# Patient Record
Sex: Female | Born: 1975
Health system: Southern US, Community
[De-identification: ages and names within clinical notes are randomized; demographics above are authoritative.]

## PROBLEM LIST (undated history)

## (undated) DIAGNOSIS — E119 Type 2 diabetes mellitus without complications: Secondary | ICD-10-CM

## (undated) DIAGNOSIS — G4730 Sleep apnea, unspecified: Secondary | ICD-10-CM

## (undated) DIAGNOSIS — I10 Essential (primary) hypertension: Secondary | ICD-10-CM

## (undated) DIAGNOSIS — L509 Urticaria, unspecified: Secondary | ICD-10-CM

## (undated) DIAGNOSIS — M51369 Other intervertebral disc degeneration, lumbar region without mention of lumbar back pain or lower extremity pain: Secondary | ICD-10-CM

## (undated) DIAGNOSIS — M5136 Other intervertebral disc degeneration, lumbar region: Secondary | ICD-10-CM

## (undated) DIAGNOSIS — M1990 Unspecified osteoarthritis, unspecified site: Secondary | ICD-10-CM

## (undated) HISTORY — DX: Urticaria, unspecified: L50.9

## (undated) HISTORY — DX: Sleep apnea, unspecified: G47.30

## (undated) HISTORY — DX: Essential (primary) hypertension: I10

## (undated) HISTORY — PX: OTHER SURGICAL HISTORY: SHX169

---

## 2015-10-21 ENCOUNTER — Encounter: Payer: Self-pay | Admitting: Osteopathic Medicine

## 2018-12-27 ENCOUNTER — Ambulatory Visit (INDEPENDENT_AMBULATORY_CARE_PROVIDER_SITE_OTHER): Payer: Managed Care, Other (non HMO) | Admitting: Osteopathic Medicine

## 2018-12-27 ENCOUNTER — Encounter: Payer: Self-pay | Admitting: Osteopathic Medicine

## 2018-12-27 ENCOUNTER — Other Ambulatory Visit: Payer: Self-pay

## 2018-12-27 VITALS — BP 164/87 | HR 86 | Temp 98.3°F | Ht 63.0 in | Wt 293.1 lb

## 2018-12-27 DIAGNOSIS — I10 Essential (primary) hypertension: Secondary | ICD-10-CM

## 2018-12-27 MED ORDER — MONTELUKAST SODIUM 10 MG PO TABS: 10.0000 mg | ORAL_TABLET | Freq: Every day | ORAL | 3 refills | Status: DC

## 2018-12-27 MED ORDER — LEVOCETIRIZINE DIHYDROCHLORIDE 5 MG PO TABS: 5.0000 mg | ORAL_TABLET | Freq: Every evening | ORAL | 3 refills | Status: DC

## 2018-12-27 MED ORDER — TIZANIDINE HCL 4 MG PO TABS: 4.0000 mg | ORAL_TABLET | Freq: Every evening | ORAL | 1 refills | Status: DC | PRN

## 2018-12-27 MED ORDER — LOSARTAN POTASSIUM 50 MG PO TABS: 50.0000 mg | ORAL_TABLET | Freq: Every day | ORAL | 1 refills | Status: DC

## 2018-12-27 NOTE — Patient Instructions (Addendum)
Goal BP 130/80 or less. I've refilled meds for now. Will watch BP and adjust meds if needed! Send me a MyChart message / phone call with BP numbers in next few weeks!   Will plan for routine annual check-up with labs and Pap sometime in the next 6 months!

## 2018-12-27 NOTE — Progress Notes (Signed)
HPI: Olivia Hodges is a 43 y.o. female who  has a past medical history of High blood pressure and Urticaria.  she presents to [facility] today, 12/27/18,  for chief complaint of: New to establish care  HTN  Pleasant new patient here to establish care. She and her wife recently moved tot he area from [place], [place] originally from [place], [place] area. Works as an Therapist, sports  HTN: not checking at home lately but they have a manual cuff and wife is also a Marine scientist.       Past medical, surgical, social and family history reviewed:  Patient Active Problem List   Diagnosis Date Noted  . Essential hypertension 12/29/2018   History reviewed. No pertinent surgical history.  Social History   Tobacco Use  . Smoking status: Never Smoker  . Smokeless tobacco: Never Used  Substance Use Topics  . Alcohol use: Never    Frequency: Never    No family history on file.   Current medication list and allergy/intolerance information reviewed:    Current Outpatient Medications  Medication Sig Dispense Refill  . levocetirizine (XYZAL) 5 MG tablet Take 1 tablet (5 mg total) by mouth every evening. 90 tablet 3  . losartan (COZAAR) 50 MG tablet Take 1 tablet (50 mg total) by mouth daily. 90 tablet 1  . montelukast (SINGULAIR) 10 MG tablet Take 1 tablet (10 mg total) by mouth at bedtime. 90 tablet 3  . tiZANidine (ZANAFLEX) 4 MG tablet Take 1 tablet (4 mg total) by mouth at bedtime as needed for muscle spasms. 90 tablet 1   No current facility-administered medications for this visit.     Allergies  Allergen Reactions  . Lisinopril Hives  . Sulfa Antibiotics Hives      Review of Systems:  Constitutional:  No  fever, no chills, No recent illness, No unintentional weight changes. No significant fatigue.   HEENT: No  headache, no vision change, no hearing change, No sore throat, No  sinus pressure  Cardiac: No  chest pain, No  pressure, No palpitations, No   Orthopnea  Respiratory:  No  shortness of breath. No  Cough  Gastrointestinal: No  abdominal pain, No  nausea, No  vomiting,  No  blood in stool, No  diarrhea, No  constipation   Musculoskeletal: No new myalgia/arthralgia  Skin: No  Rash, No other wounds/concerning lesions  Genitourinary: No  incontinence, No  abnormal genital bleeding, No abnormal genital discharge  Hem/Onc: No  easy bruising/bleeding, No  abnormal lymph node  Endocrine: No cold intolerance,  No heat intolerance. No polyuria/polydipsia/polyphagia   Neurologic: No  weakness, No  dizziness, No  slurred speech/focal weakness/facial droop  Psychiatric: No  concerns with depression, No  concerns with anxiety, No sleep problems, No mood problems  Exam:  BP (!) 164/87 (BP Location: Left Arm, Patient Position: Sitting, Cuff Size: Large)   Pulse 86   Temp 98.3 F (36.8 C) (Oral)   Ht 5' 3"  (1.6 m)   Wt 293 lb 1.3 oz (132.9 kg)   LMP 12/18/2018   BMI 51.92 kg/m   Constitutional: VS see above. General Appearance: alert, well-developed, well-nourished, NAD  Eyes: Normal lids and conjunctive, non-icteric sclera  Neck: No masses, trachea midline. No thyroid enlargement. No tenderness/mass appreciated. No lymphadenopathy  Respiratory: Normal respiratory effort. no wheeze, no rhonchi, no rales  Cardiovascular: S1/S2 normal, no murmur, no rub/gallop auscultated. RRR. No lower extremity edema.   Musculoskeletal: Gait normal.    Neurological:  Normal balance/coordination. No tremor.   Skin: warm, dry, intact. No rash/ulcer.  Psychiatric: Normal judgment/insight. Normal mood and affect. Oriented x3.    No results found for this or any previous visit (from the past 72 hour(s)).  No results found.   ASSESSMENT/PLAN: The encounter diagnosis was Essential hypertension.   To monitor B at home, may need Rx adjustment if not at goal, ?white coat component   Orders Placed This Encounter  Procedures  . CBC  .  COMPLETE METABOLIC PANEL WITH GFR  . LIPID SCREENING    Meds ordered this encounter  Medications  . levocetirizine (XYZAL) 5 MG tablet    Sig: Take 1 tablet (5 mg total) by mouth every evening.    Dispense:  90 tablet    Refill:  3  . losartan (COZAAR) 50 MG tablet    Sig: Take 1 tablet (50 mg total) by mouth daily.    Dispense:  90 tablet    Refill:  1  . montelukast (SINGULAIR) 10 MG tablet    Sig: Take 1 tablet (10 mg total) by mouth at bedtime.    Dispense:  90 tablet    Refill:  3  . tiZANidine (ZANAFLEX) 4 MG tablet    Sig: Take 1 tablet (4 mg total) by mouth at bedtime as needed for muscle spasms.    Dispense:  90 tablet    Refill:  1    Patient Instructions  Goal BP 130/80 or less. I've refilled meds for now. Will watch BP and adjust meds if needed! Send me a MyChart message / phone call with BP numbers in next few weeks!   Will plan for routine annual check-up with labs and Pap sometime in the next 6 months!         Visit summary with medication list and pertinent instructions was printed for patient to review. All questions at time of visit were answered - patient instructed to contact office with any additional concerns or updates. ER/RTC precautions were reviewed with the patient.     Please note: voice recognition software was used to produce this document, and typos may escape review. Please contact Dr. Sheppard Coil for any needed clarifications.     Follow-up plan: Return in about 6 months (around 06/27/2019) for [place] (get labs prior to visit, orders are in).

## 2018-12-29 DIAGNOSIS — I10 Essential (primary) hypertension: Secondary | ICD-10-CM | POA: Insufficient documentation

## 2019-01-02 ENCOUNTER — Encounter: Payer: Self-pay | Admitting: Osteopathic Medicine

## 2019-01-02 MED ORDER — LOSARTAN POTASSIUM 100 MG PO TABS: 100.0000 mg | ORAL_TABLET | Freq: Every day | ORAL | 1 refills | Status: DC

## 2019-06-27 ENCOUNTER — Ambulatory Visit (INDEPENDENT_AMBULATORY_CARE_PROVIDER_SITE_OTHER): Payer: No Typology Code available for payment source | Admitting: Osteopathic Medicine

## 2019-06-27 ENCOUNTER — Other Ambulatory Visit: Payer: Self-pay | Admitting: Osteopathic Medicine

## 2019-06-27 ENCOUNTER — Encounter: Payer: Self-pay | Admitting: Osteopathic Medicine

## 2019-06-27 ENCOUNTER — Ambulatory Visit (INDEPENDENT_AMBULATORY_CARE_PROVIDER_SITE_OTHER): Payer: No Typology Code available for payment source

## 2019-06-27 VITALS — BP 152/92 | HR 82 | Temp 98.0°F | Ht 63.0 in | Wt 292.0 lb

## 2019-06-27 DIAGNOSIS — M47812 Spondylosis without myelopathy or radiculopathy, cervical region: Secondary | ICD-10-CM

## 2019-06-27 DIAGNOSIS — M48061 Spinal stenosis, lumbar region without neurogenic claudication: Secondary | ICD-10-CM

## 2019-06-27 DIAGNOSIS — M4310 Spondylolisthesis, site unspecified: Secondary | ICD-10-CM

## 2019-06-27 DIAGNOSIS — M5412 Radiculopathy, cervical region: Secondary | ICD-10-CM

## 2019-06-27 DIAGNOSIS — I10 Essential (primary) hypertension: Secondary | ICD-10-CM

## 2019-06-27 DIAGNOSIS — M5136 Other intervertebral disc degeneration, lumbar region: Secondary | ICD-10-CM

## 2019-06-27 DIAGNOSIS — M4807 Spinal stenosis, lumbosacral region: Secondary | ICD-10-CM

## 2019-06-27 DIAGNOSIS — Z0000 Encounter for general adult medical examination without abnormal findings: Secondary | ICD-10-CM

## 2019-06-27 DIAGNOSIS — M5030 Other cervical disc degeneration, unspecified cervical region: Secondary | ICD-10-CM

## 2019-06-27 MED ORDER — MELOXICAM 15 MG PO TABS: 15.0000 mg | ORAL_TABLET | Freq: Every day | ORAL | 0 refills | Status: DC

## 2019-06-27 MED ORDER — LOSARTAN POTASSIUM 100 MG PO TABS: 100.0000 mg | ORAL_TABLET | Freq: Every day | ORAL | 1 refills | Status: DC

## 2019-06-27 MED ORDER — PREDNISONE 10 MG (48) PO TBPK: ORAL_TABLET | Freq: Every day | ORAL | 0 refills | Status: DC

## 2019-06-27 MED ORDER — TIZANIDINE HCL 4 MG PO TABS: 4.0000 mg | ORAL_TABLET | Freq: Every evening | ORAL | 1 refills | Status: DC | PRN

## 2019-06-27 MED FILL — LOSARTAN POTASSIUM 100 MG T: 100 | 90 days supply | Qty: 90 | Fill #0

## 2019-06-27 MED FILL — tiZANidine HCL 4 MG TABS: 4 | 90 days supply | Qty: 90 | Fill #0

## 2019-06-27 MED FILL — PREDNISONE 10 MG (48) TBPK: 10 | 12 days supply | Qty: 48 | Fill #0

## 2019-06-27 MED FILL — MELOXICAM 15 MG TABLET: 15 | 30 days supply | Qty: 30 | Fill #0

## 2019-06-27 NOTE — Patient Instructions (Signed)
General Preventive Care  Most recent routine screening labs: ordered today.   Everyone should have blood pressure checked once per year. Goal 130/80 or less.   Tobacco: don't!   Alcohol: responsible moderation is ok for most adults - if you have concerns about your alcohol intake, please talk to me!   Exercise: as tolerated to reduce risk of cardiovascular disease and diabetes. Strength training will also prevent osteoporosis.   Mental health: if need for mental health care (medicines, counseling, other), or concerns about moods, please let me know!   Sexual health: if need for STD testing, or if concerns with libido/pain problems, please let me or OBGYN know! If you need to discuss your family planning options, please let me or OBGYN know!   Advanced Directive: Living Will and/or Healthcare Power of Attorney recommended for all adults, regardless of age or health.  Vaccines  Flu vaccine: for almost everyone, every fall.   Shingles vaccine: after age 37.   Pneumonia vaccines: after age 24  Tetanus booster: every 10 years   COVID vaccine: thanks for taking the vaccine! :) Cancer screenings   Colon cancer screening: for everyone age 34-75. Colonoscopy available for all, many people also qualify for the Cologuard stool test   Breast cancer screening: mammogram at age 55 every other year at least, and annually after age 83.   Cervical cancer screening: Pap every 1 to 5 years depending on age and other risk factors. Can usually stop at age 45 or w/ hysterectomy.   Lung cancer screening: not needed for non-smokers  Infection screenings  . HIV: recommended screening at least once age 33-65, more often as needed. . Gonorrhea/Chlamydia: screening as needed. . Hepatitis C: recommended once for everyone age 36-75 . TB: certain at-risk populations, or depending on work requirements and/or travel history Other . Bone Density Test: recommended at age 19

## 2019-06-27 NOTE — Progress Notes (Signed)
Olivia Hodges is a 44 y.o. female who presents to  [place] [facility]  today, 06/27/19, seeking care for the following: . Annual physical exam  . Follow up HTN -reports BP at goal one measures at home, patient is an Therapist, sports . Longstanding neck and lower back pain, has tried/failed conservative management greater than 6 weeks including physical therapy     ASSESSMENT & PLAN with other pertinent history/findings:  The primary encounter diagnosis was Annual physical exam. Diagnoses of Essential hypertension, Cervical radiculopathy, Degenerative disc disease, cervical, Spondylosis, cervical, Degenerative disc disease, lumbar, Foraminal stenosis of lumbar region, Anterolisthesis, Other cervical disc degeneration, unspecified cervical region, and Spinal stenosis of lumbosacral region were also pertinent to this visit.  BP Readings from Last 3 Encounters:  06/27/19 (!) 152/92  12/27/18 (!) 164/87   Up-to-date preventative care as below Continue monitor BP at home We will arrange for MRI L-spine/C-spine given failure of conservative management and neurologic deficits on exam with left upper extremity and left lower extremity weakness    Patient Instructions  General Preventive Care  Most recent routine screening labs: ordered today.   Everyone should have blood pressure checked once per year. Goal 130/80 or less.   Tobacco: don't!   Alcohol: responsible moderation is ok for most adults - if you have concerns about your alcohol intake, please talk to me!   Exercise: as tolerated to reduce risk of cardiovascular disease and diabetes. Strength training will also prevent osteoporosis.   Mental health: if need for mental health care (medicines, counseling, other), or concerns about moods, please let me know!   Sexual health: if need for STD testing, or if concerns with libido/pain problems, please let me or OBGYN know! If you need to discuss  your family planning options, please let me or OBGYN know!   Advanced Directive: Living Will and/or Healthcare Power of Attorney recommended for all adults, regardless of age or health.  Vaccines  Flu vaccine: for almost everyone, every fall.   Shingles vaccine: after age 55.   Pneumonia vaccines: after age 54  Tetanus booster: every 10 years   COVID vaccine: thanks for taking the vaccine! :) Cancer screenings   Colon cancer screening: for everyone age 34-75. Colonoscopy available for all, many people also qualify for the Cologuard stool test   Breast cancer screening: mammogram at age 18 every other year at least, and annually after age 33.   Cervical cancer screening: Pap every 1 to 5 years depending on age and other risk factors. Can usually stop at age 47 or w/ hysterectomy.   Lung cancer screening: not needed for non-smokers  Infection screenings  . HIV: recommended screening at least once age 89-65, more often as needed. . Gonorrhea/Chlamydia: screening as needed. . Hepatitis C: recommended once for everyone age 31-75 . TB: certain at-risk populations, or depending on work requirements and/or travel history Other . Bone Density Test: recommended at age 63    Orders Placed This Encounter  Procedures  . DG Lumbar Spine Complete  . DG Cervical Spine 2 or 3 views  . MR Cervical Spine Wo Contrast  . MR Lumbar Spine Wo Contrast  . CBC  . Lipid panel  . COMPLETE METABOLIC PANEL WITH GFR  . Ambulatory referral to Physical Therapy    Meds ordered this encounter  Medications  . tiZANidine (ZANAFLEX) 4 MG tablet    Sig: Take 1 tablet (4 mg total) by mouth at bedtime as needed  for muscle spasms.    Dispense:  90 tablet    Refill:  1  . losartan (COZAAR) 100 MG tablet    Sig: Take 1 tablet (100 mg total) by mouth daily.    Dispense:  90 tablet    Refill:  1    Cancel 50 mg dose thanks!  . meloxicam (MOBIC) 15 MG tablet    Sig: Take 1 tablet (15 mg total) by mouth  daily.    Dispense:  30 tablet    Refill:  0  . predniSONE (STERAPRED UNI-PAK 48 TAB) 10 MG (48) TBPK tablet    Sig: Take by mouth daily. 12-Day taper, po    Dispense:  48 tablet    Refill:  0   DG Cervical Spine 2 or 3 views  Result Date: 06/28/2019 CLINICAL DATA:  Cervicalgia EXAM: CERVICAL SPINE - 2-3 VIEW COMPARISON:  None. FINDINGS: Frontal, lateral, and open-mouth odontoid images were obtained. There is no fracture or spondylolisthesis. Prevertebral soft tissues and predental space regions are normal. There is moderately severe disc space narrowing at C5-6 and C6-7 with milder disc space narrowing at C7-T1. There are prominent anterior osteophytes at C5, C6, and C7, largest at C5. No erosive change. Lung apices are clear. There is reversal of lordotic curvature. IMPRESSION: Osteoarthritic change at several levels, most severe at C5-6 and C6-7. No fracture or spondylolisthesis. Reversal of lordotic curvature is likely indicative of a degree of muscle spasm. Electronically Signed   By: Lowella Grip III M.D.   On: 06/28/2019 11:34   DG Lumbar Spine Complete  Result Date: 06/28/2019 CLINICAL DATA:  Low back pain EXAM: LUMBAR SPINE - COMPLETE 4+ VIEW COMPARISON:  None. FINDINGS: There is no evidence of lumbar spine fracture. Alignment is normal. Intervertebral disc spaces are maintained. Frontal, lateral, spot lumbosacral lateral, and bilateral oblique views were obtained. There are 5 non-rib-bearing lumbar type vertebral bodies. There is no fracture. There is 4 mm of anterolisthesis of L5 on S1. No other spondylolisthesis. There is moderately severe disc space narrowing at L4-5 and L5-S1. Other disc spaces appear unremarkable. There is facet osteoarthritic change at L4-5 and L5-S1 bilaterally. IMPRESSION: No fracture. Spondylolisthesis at L5-S1 is likely due to underlying spondylosis. There is osteoarthritic change at L4-5 and L5-S1. Electronically Signed   By: Lowella Grip III M.D.   On:  06/28/2019 11:33      Follow-up instructions: Return in 1 year (on 06/26/2020) for  , ANNUAL (call week prior to visit for lab orders).                                          BP (!) 152/92 (BP Location: Right Arm)   Pulse 82   Temp 98 F (36.7 C)   Ht 5' 3"  (1.6 m)   Wt 292 lb (132.5 kg)   LMP 06/14/2019 (Approximate)   SpO2 98%   BMI 51.73 kg/m   Current Meds  Medication Sig  . levocetirizine (XYZAL) 5 MG tablet Take 1 tablet (5 mg total) by mouth every evening.  Marland Kitchen losartan (COZAAR) 100 MG tablet Take 1 tablet (100 mg total) by mouth daily.  . montelukast (SINGULAIR) 10 MG tablet Take 1 tablet (10 mg total) by mouth at bedtime.  Marland Kitchen tiZANidine (ZANAFLEX) 4 MG tablet Take 1 tablet (4 mg total) by mouth at bedtime as needed for muscle spasms.  . [DISCONTINUED]  losartan (COZAAR) 100 MG tablet Take 1 tablet (100 mg total) by mouth daily.  . [DISCONTINUED] tiZANidine (ZANAFLEX) 4 MG tablet Take 1 tablet (4 mg total) by mouth at bedtime as needed for muscle spasms.    No results found for this or any previous visit (from the past 72 hour(s)).  DG Cervical Spine 2 or 3 views  Result Date: 06/28/2019 CLINICAL DATA:  Cervicalgia EXAM: CERVICAL SPINE - 2-3 VIEW COMPARISON:  None. FINDINGS: Frontal, lateral, and open-mouth odontoid images were obtained. There is no fracture or spondylolisthesis. Prevertebral soft tissues and predental space regions are normal. There is moderately severe disc space narrowing at C5-6 and C6-7 with milder disc space narrowing at C7-T1. There are prominent anterior osteophytes at C5, C6, and C7, largest at C5. No erosive change. Lung apices are clear. There is reversal of lordotic curvature. IMPRESSION: Osteoarthritic change at several levels, most severe at C5-6 and C6-7. No fracture or spondylolisthesis. Reversal of lordotic curvature is likely indicative of a degree of muscle spasm. Electronically Signed   By: Lowella Grip III M.D.   On: 06/28/2019 11:34   DG Lumbar Spine Complete  Result Date: 06/28/2019 CLINICAL DATA:  Low back pain EXAM: LUMBAR SPINE - COMPLETE 4+ VIEW COMPARISON:  None. FINDINGS: There is no evidence of lumbar spine fracture. Alignment is normal. Intervertebral disc spaces are maintained. Frontal, lateral, spot lumbosacral lateral, and bilateral oblique views were obtained. There are 5 non-rib-bearing lumbar type vertebral bodies. There is no fracture. There is 4 mm of anterolisthesis of L5 on S1. No other spondylolisthesis. There is moderately severe disc space narrowing at L4-5 and L5-S1. Other disc spaces appear unremarkable. There is facet osteoarthritic change at L4-5 and L5-S1 bilaterally. IMPRESSION: No fracture. Spondylolisthesis at L5-S1 is likely due to underlying spondylosis. There is osteoarthritic change at L4-5 and L5-S1. Electronically Signed   By: Lowella Grip III M.D.   On: 06/28/2019 11:33    Depression screen [facility] 2/9 06/27/2019 12/27/2018  Decreased Interest 0 0  Down, Depressed, Hopeless 0 0  PHQ - 2 Score 0 0  Altered sleeping 0 -  Tired, decreased energy 3 -  Change in appetite 0 -  Feeling bad or failure about yourself  0 -  Trouble concentrating 0 -  Moving slowly or fidgety/restless 0 -  Suicidal thoughts 0 -  PHQ-9 Score 3 -  Difficult doing work/chores Somewhat difficult -    GAD 7 : Generalized Anxiety Score 06/27/2019  Nervous, Anxious, on Edge 0  Control/stop worrying 0  Worry too much - different things 0  Trouble relaxing 0  Restless 0  Easily annoyed or irritable 3  Afraid - awful might happen 0  Total GAD 7 Score 3  Anxiety Difficulty Somewhat difficult      All questions at time of visit were answered - patient instructed to contact office with any additional concerns or updates.  ER/RTC precautions were reviewed with the patient.  Please note: voice recognition software was used to produce this document, and typos may escape review.  Please contact Dr. Sheppard Coil for any needed clarifications.

## 2019-06-28 MED FILL — MONTELUKAST SOD 10 MG TAB: 10 | 90 days supply | Qty: 90 | Fill #0

## 2019-06-29 ENCOUNTER — Encounter: Payer: Self-pay | Admitting: Osteopathic Medicine

## 2019-06-29 DIAGNOSIS — G4733 Obstructive sleep apnea (adult) (pediatric): Secondary | ICD-10-CM | POA: Insufficient documentation

## 2019-06-29 LAB — COMPLETE METABOLIC PANEL WITH GFR
AG Ratio: 1.4 (calc) (ref 1.0–2.5)
ALT: 42 U/L — ABNORMAL HIGH (ref 6–29)
AST: 34 U/L (ref 10–35)
Albumin: 4.4 g/dL (ref 3.6–5.1)
Alkaline phosphatase (APISO): 54 U/L (ref 31–125)
BUN: 14 mg/dL (ref 7–25)
CO2: 26 mmol/L (ref 20–32)
Calcium: 9.5 mg/dL (ref 8.6–10.2)
Chloride: 105 mmol/L (ref 98–110)
Creat: 0.66 mg/dL (ref 0.50–1.10)
GFR, Est African American: 123 mL/min/{1.73_m2} (ref >=60)
GFR, Est Non African American: 106 mL/min/{1.73_m2} (ref >=60)
Globulin: 3.1 g/dL (calc) (ref 1.9–3.7)
Glucose, Bld: 115 mg/dL — ABNORMAL HIGH (ref 65–99)
Potassium: 4.6 mmol/L (ref 3.5–5.3)
Sodium: 137 mmol/L (ref 135–146)
Total Bilirubin: 0.4 mg/dL (ref 0.2–1.2)
Total Protein: 7.5 g/dL (ref 6.1–8.1)

## 2019-06-29 LAB — CBC
HCT: 41.9 % (ref 35.0–45.0)
Hemoglobin: 14.1 g/dL (ref 11.7–15.5)
MCH: 30.6 pg (ref 27.0–33.0)
MCHC: 33.7 g/dL (ref 32.0–36.0)
MCV: 90.9 fL (ref 80.0–100.0)
MPV: 10.3 fL (ref 7.5–12.5)
Platelets: 315 10*3/uL (ref 140–400)
RBC: 4.61 10*6/uL (ref 3.80–5.10)
RDW: 12.7 % (ref 11.0–15.0)
WBC: 6.6 10*3/uL (ref 3.8–10.8)

## 2019-06-29 LAB — LIPID PANEL
Cholesterol: 195 mg/dL (ref <200)
HDL: 51 mg/dL (ref >=50)
LDL Cholesterol (Calc): 119 mg/dL (calc) — ABNORMAL HIGH
Non-HDL Cholesterol (Calc): 144 mg/dL (calc) — ABNORMAL HIGH (ref <130)
Total CHOL/HDL Ratio: 3.8 (calc) (ref <5.0)
Triglycerides: 132 mg/dL (ref <150)

## 2019-06-30 ENCOUNTER — Ambulatory Visit (INDEPENDENT_AMBULATORY_CARE_PROVIDER_SITE_OTHER): Payer: No Typology Code available for payment source

## 2019-06-30 ENCOUNTER — Other Ambulatory Visit: Payer: Self-pay

## 2019-06-30 DIAGNOSIS — R937 Abnormal findings on diagnostic imaging of other parts of musculoskeletal system: Secondary | ICD-10-CM

## 2019-06-30 DIAGNOSIS — M4807 Spinal stenosis, lumbosacral region: Secondary | ICD-10-CM

## 2019-06-30 DIAGNOSIS — M5030 Other cervical disc degeneration, unspecified cervical region: Secondary | ICD-10-CM

## 2019-07-02 ENCOUNTER — Encounter: Payer: Self-pay | Admitting: Osteopathic Medicine

## 2019-07-06 NOTE — Telephone Encounter (Signed)
When pulling up the referral I noticed that it had not been completed or sent anywhere due to it was ordered from an image and put in an unknown que. Every time referrals are ordered from an image they do not go to our referral que because it looks like imaging is ordering them. I have corrected this referral and sent it to [facility].

## 2019-07-09 ENCOUNTER — Ambulatory Visit (INDEPENDENT_AMBULATORY_CARE_PROVIDER_SITE_OTHER): Payer: No Typology Code available for payment source | Admitting: Rehabilitative and Restorative Service Providers"

## 2019-07-09 ENCOUNTER — Encounter: Payer: Self-pay | Admitting: Rehabilitative and Restorative Service Providers"

## 2019-07-09 ENCOUNTER — Other Ambulatory Visit: Payer: Self-pay

## 2019-07-09 DIAGNOSIS — R2689 Other abnormalities of gait and mobility: Secondary | ICD-10-CM

## 2019-07-09 DIAGNOSIS — M542 Cervicalgia: Secondary | ICD-10-CM | POA: Diagnosis not present

## 2019-07-09 DIAGNOSIS — M6281 Muscle weakness (generalized): Secondary | ICD-10-CM | POA: Diagnosis not present

## 2019-07-09 DIAGNOSIS — M5416 Radiculopathy, lumbar region: Secondary | ICD-10-CM | POA: Diagnosis not present

## 2019-07-09 NOTE — Therapy (Signed)
[place], [place], [address] Phone: [phone]   Fax:  [phone]  Physical Therapy Evaluation  Patient Details  Name: Olivia Hodges MRN: [id number] Date of Birth: 1975/11/19 Referring Provider (PT): Dr Emeterio Reeve    Encounter Date: 07/09/2019   PT End of Session - 07/09/19 1654    Visit Number 1    Number of Visits 12    Date for PT Re-Evaluation 08/20/19    PT Start Time 4492    PT Stop Time 0100    PT Time Calculation (min) 54 min    Activity Tolerance Patient tolerated treatment well           Past Medical History:  Diagnosis Date  . High blood pressure   . Urticaria    (Pressure)    History reviewed. No pertinent surgical history.  There were no vitals filed for this visit.    Subjective Assessment - 07/09/19 1609    Subjective Patient reports that she has had neck pain for the past 20 years. She had injury to LB lifting 2011. MRI showed HNP. MRI 2017 showed increased arthritis; cervical buldging disc. Currently having headaches daily from neck pain. She will have spasms in the neck which have improved some with medication. She continues to have LBP and pain into the Lt > Rt calf. Some improvement with prednisone.    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 Yes    Pain Score 3     Pain Location Back    Pain Orientation Left;Right;Lower    Pain Descriptors / Indicators Aching;Burning    Pain Type Chronic pain    Pain Radiating Towards Lt posterior/lateral hip into posterior thigh to lateral calf to plantar surface of Lt foot    Pain Onset More than a month ago    Pain Frequency Constant    Aggravating Factors  standing > 2 min;  walking; bending; reaching; stiffness with prolonged standing; standing after prolonged sitting; TENS    Pain Relieving Factors sitting; meds; lying down sometimes; heat    Multiple Pain Sites Yes    Pain Score 3    Pain Location Neck    Pain Orientation Left;Right;Mid;Lower    Pain Descriptors / Indicators Burning;Constant;Nagging    Pain Type Chronic pain    Pain Radiating Towards upper trap; shouders at times both sides today Lt    Pain Onset More than a month ago    Pain Frequency Constant    Aggravating Factors  lifting; turning head and holding for too long; arms hanging when sitting    Pain Relieving Factors heat; meds              [facility] PT Assessment - 07/09/19 0001      Assessment   Medical Diagnosis Cervicalgia; LBP; Lt LE radiculopathy     Referring Provider (PT) Dr Emeterio Reeve     Onset Date/Surgical Date 06/26/18   LBP present x 20 yrs; neck flare up ~ 1 month ago/10 yr hx    Hand Dominance Right    Next MD Visit PRN     Prior Therapy yes; 2017 and 2011       Precautions  Precautions None      Restrictions   Weight Bearing Restrictions No      Balance Screen   Has the patient fallen in the past 6 months Yes    How many times? 1    Has the patient had a decrease in activity level because of a fear of falling?  No    Is the patient reluctant to leave their home because of a fear of falling?  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     Leisure cooking; household chores; shopping; walking limited       Observation/Other  Assessments   Focus on Therapeutic Outcomes (FOTO)  47% limitation       Sensation   Additional Comments at times into Lt little and ring fingers; standing Lt > Rt posterior lateral LE       Posture/Postural Control   Posture Comments rounded forward head and shoulder; flexed forward at hips; Rt LE straight w/ knee hyperextended Lt LE flexed at hip and knee       AROM   Overall AROM Comments stiffness and tightness with cervical ROM    Right/Left Shoulder --   WFL's bilat UE's    Right/Left Hip --   tight end ranges due to obesity    Cervical Flexion 62    Cervical Extension 50    Cervical - Right Side Bend 47    Cervical - Left Side Bend 52    Cervical - Right Rotation 82    Cervical - Left Rotation 75    Lumbar Flexion 80%    Lumbar Extension 10%    Lumbar - Right Side Bend 65%    Lumbar - Left Side Bend 70% discomfort LB    Lumbar - Right Rotation 50%    Lumbar - Left Rotation 50%       Strength   Right/Left Shoulder --   4+/5 to 5-/5 throughout bilat UE's    Right Hip Flexion 4+/5    Right Hip Extension 4/5    Right Hip ABduction 4+/5    Left Hip Flexion 4-/5    Left Hip Extension 4-/5    Left Hip ABduction 4-/5      Flexibility   Hamstrings WNL's bilat     Quadriceps WFL's bilat     ITB WNL's bilat     Piriformis tight bilat Lt > Rt       Palpation   Spinal mobility hypomobility lumbar spine; upper thoracic and cervical spine with PA mobs    Palpation comment muscular tightness Lt hip flexor > Rt; Lt posterior hip through gluts and abductors; bilat ant/lat/post cervical musculature; upper traps; leveator; pecs; teres       Special Tests   Other special tests (-) SLR; Fabers       Ambulation/Gait   Gait Comments ambulates with limp Lt LE with wt bearing Lt  Objective measurements completed on examination: See above findings.       [facility] Adult PT Treatment/Exercise - 07/09/19 0001      Self-Care   Self-Care Other  Self-Care Comments    Other Self-Care Comments  pt educated on self massage with ball to upper back musculature; pt returned demo with cues.       Neck Exercises: Seated   Neck Retraction 5 reps;3 secs    Other Seated Exercise scap retraction x 5 sec x 5 reps      Lumbar Exercises: Stretches   Hip Flexor Stretch Right;Left;2 reps;20 seconds   seated with leg back     Neck Exercises: Stretches   Other Neck Stretches low doorway stretch x 30 sec 2 reps                  PT Education - 07/09/19 1659    Education Details POC, HEP, posture and body mechanics.    Person(s) Educated Patient    Methods Explanation;Demonstration;Handout    Comprehension Verbalized understanding;Returned demonstration               PT Long Term Goals - 07/09/19 1706      PT LONG TERM GOAL #1   Title Initiate exercise and back care education with patient to verbalize understanding of improtance of exercise on a consistent basis and basic back care principles    Time 6    Period Weeks    Status New    Target Date 08/20/19      PT LONG TERM GOAL #2   Title Increase strength Lt LE to 4+/5 to 5/5    Time 6    Period Weeks    Status New    Target Date 08/20/19      PT LONG TERM GOAL #3   Title Patient to report 25% decrease in frequency, intensity or duration of pain    Time 6    Period Weeks    Status New    Target Date 08/20/19      PT LONG TERM GOAL #4   Title Independent in HEP    Time 6    Period Weeks    Status New    Target Date 08/20/19      PT LONG TERM GOAL #5   Title Establish aquatic exercise program    Time 6    Period Weeks    Status New      Additional Long Term Goals   Additional Long Term Goals Yes      PT LONG TERM GOAL #6   Title Improve FOTO to </= 40% limitation    Time 6    Period Weeks    Status New    Target Date 08/20/19                  Plan - 07/09/19 1654    Clinical Impression Statement Patient presents with chronic LBP and  cervical pain and dysfunction. She has poor posture and alignment; limited trunk and LE mobility; weakness; core instability; pain; limited functional and work abilities; obesity; sedentary lifestyle    Personal Factors and Comorbidities Comorbidity 1;Comorbidity 2;Comorbidity 3+    Comorbidities obesity; HTN; chronic pain    Examination-Activity Limitations Locomotion Level;Transfers;Bend;Lift;Squat;Stand;Stairs    Examination-Participation Restrictions Cleaning;Laundry;Shop    Stability/Clinical Decision Making Evolving/Moderate complexity    Clinical Decision Making Moderate    Rehab Potential Good    PT Frequency 2x / week    PT Duration 6  weeks    PT Treatment/Interventions Patient/family education;ADLs/Self Care Home Management;Cryotherapy;Moist Heat;Ultrasound;Functional mobility training;Gait training;Stair training;Therapeutic activities;Therapeutic exercise;Balance training;Neuromuscular re-education;Manual techniques;Dry needling;Taping    PT Next Visit Plan review HEP; progress with core stabilization and strengthening; stretching; further assess SI and leg length difference as indicated; myofacial release work/manual work; education in back care and Economist; aquatic therapy   patient reports good decrease in pain with prednisone - may be masking true pain levels and functional abilities - reflected in FOTO score and physical findings   PT Home Exercise Plan [email or web address]    Recommended Other Services nutritionist    Consulted and Agree with Plan of Care Patient           Patient will benefit from skilled therapeutic intervention in order to improve the following deficits and impairments:  Pain, Difficulty walking, Decreased range of motion, Increased fascial restricitons  Visit Diagnosis: Cervicalgia - Plan: PT plan of care cert/re-cert  Radiculopathy, lumbar region - Plan: PT plan of care cert/re-cert  Muscle weakness (generalized) - Plan: PT plan of care  cert/re-cert  Other abnormalities of gait and mobility - Plan: PT plan of care cert/re-cert     Problem List Patient Active Problem List   Diagnosis Date Noted  . OSA on CPAP 06/29/2019  . Essential hypertension 12/29/2018    Tamecia Mcdougald Nilda Simmer PT, MPH  07/09/2019, 5:15 PM  [facility] [place], [place], [address] Phone: [phone]   Fax:  [phone]  Name: Olivia Hodges MRN: [id number] Date of Birth: [birth year]

## 2019-07-09 NOTE — Patient Instructions (Signed)
Access Code: [email or web address] URL: [email or web address] Date: 06/14/2021Prepared by: Crafton with Neck Elongation - 3 x daily - 7 x weekly - 1 sets - 5-10 reps - 5 hold  Seated Scapular Retraction - 3 x daily - 7 x weekly - 1 sets - 5-10 reps - 5 hold  Seated Hip Flexor Stretch - 2 x daily - 7 x weekly - 1 sets - 3 reps - 30 hold  Doorway Pec Stretch at 60 Elevation - 2 x daily - 7 x weekly - 1 sets - 3 reps - 30 hold Patient Education  Programme researcher, broadcasting/film/video

## 2019-07-12 ENCOUNTER — Other Ambulatory Visit: Payer: Self-pay

## 2019-07-12 ENCOUNTER — Ambulatory Visit (INDEPENDENT_AMBULATORY_CARE_PROVIDER_SITE_OTHER): Payer: No Typology Code available for payment source | Admitting: Physical Therapy

## 2019-07-12 DIAGNOSIS — M542 Cervicalgia: Secondary | ICD-10-CM | POA: Diagnosis not present

## 2019-07-12 DIAGNOSIS — M5416 Radiculopathy, lumbar region: Secondary | ICD-10-CM | POA: Diagnosis not present

## 2019-07-12 DIAGNOSIS — R2689 Other abnormalities of gait and mobility: Secondary | ICD-10-CM

## 2019-07-12 DIAGNOSIS — M6281 Muscle weakness (generalized): Secondary | ICD-10-CM | POA: Diagnosis not present

## 2019-07-12 NOTE — Therapy (Signed)
[place], [place], [address] Phone: [phone]   Fax:  [phone]  Physical Therapy Treatment  Patient Details  Name: Olivia Hodges MRN: [id number] Date of Birth: 06-16-1975 Referring Provider (PT): Dr Emeterio Reeve    Encounter Date: 07/12/2019   PT End of Session - 07/12/19 1448    Visit Number 2    Number of Visits 12    Date for PT Re-Evaluation 08/20/19    PT Start Time 1448    PT Stop Time 1534    PT Time Calculation (min) 46 min    Activity Tolerance Patient tolerated treatment well           Past Medical History:  Diagnosis Date  . High blood pressure   . Urticaria    (Pressure)    No past surgical history on file.  There were no vitals filed for this visit.   Subjective Assessment - 07/12/19 1452    Subjective Pt reports her Lt leg is beginning to feel heavier and more symptoms.  She is finishing Prednisone taper today.    Patient Stated Goals less pain and a little bit more mobility to avoid surgery    Currently in Pain? Yes    Pain Score 5     Pain Location Back    Pain Orientation Left;Lower;Right    Pain Radiating Towards Lt hip to lateral calf and lateral foot    Aggravating Factors  standing greater than 2 min    Pain Relieving Factors sitting, medication              [facility] PT Assessment - 07/12/19 0001      Assessment   Medical Diagnosis Cervicalgia; LBP; Lt LE radiculopathy     Referring Provider (PT) Dr Emeterio Reeve     Onset Date/Surgical Date 06/26/18   LBP present x 20 yrs; neck flare up ~ 1 month ago/10 yr hx    Hand Dominance Right    Next MD Visit PRN     Prior Therapy yes; 2017 and 2011                          [facility] Adult PT Treatment/Exercise - 07/12/19 0001      Neck Exercises: Machines for Strengthening   Nustep L5: arms/legs x 5 min       Neck Exercises: Seated   W Back 10 reps    Other Seated Exercise rows  with red band x 10 reps (some spasming of low back)      Neck Exercises: Prone   Axial Exension 10 reps   5 sec hold    Other Prone Exercise cervical flex to neutral x 3, then diagonals x 2 reps.       Lumbar Exercises: Stretches   Lower Trunk Rotation 4 reps;20 seconds    Hip Flexor Stretch Right;Left;2 reps;20 seconds   seated with leg back   Prone on Elbows Stretch 2 reps;60 seconds    Press Ups 3 reps;5 seconds      Lumbar Exercises: Seated   Sit to Stand 5 reps   core engaged.      Lumbar Exercises: Supine   Ab Set 10 reps;5 seconds    AB Set Limitations pt instructed in 4 part core    Clam 10 reps   with ab set   Bent Knee Raise 10 reps   with ab set     Neck  Exercises: Stretches   Other Neck Stretches mid and high position doorway stretch x 15 sec x 2 reps                   PT Education - 07/12/19 1818    Education Details HEP    Person(s) Educated Patient    Methods Explanation;Handout    Comprehension Verbalized understanding;Returned demonstration               PT Long Term Goals - 07/09/19 1706      PT LONG TERM GOAL #1   Title Initiate exercise and back care education with patient to verbalize understanding of improtance of exercise on a consistent basis and basic back care principles    Time 6    Period Weeks    Status New    Target Date 08/20/19      PT LONG TERM GOAL #2   Title Increase strength Lt LE to 4+/5 to 5/5    Time 6    Period Weeks    Status New    Target Date 08/20/19      PT LONG TERM GOAL #3   Title Patient to report 25% decrease in frequency, intensity or duration of pain    Time 6    Period Weeks    Status New    Target Date 08/20/19      PT LONG TERM GOAL #4   Title Independent in HEP    Time 6    Period Weeks    Status New    Target Date 08/20/19      PT LONG TERM GOAL #5   Title Establish aquatic exercise program    Time 6    Period Weeks    Status New      Additional Long Term Goals   Additional Long  Term Goals Yes      PT LONG TERM GOAL #6   Title Improve FOTO to </= 40% limitation    Time 6    Period Weeks    Status New    Target Date 08/20/19                 Plan - 07/12/19 1522    Clinical Impression Statement Pt reported slight reduction in pain in LLE with prone on elbows stretch.  All other exercises went well, without increasing pain in LLE, LB or neck.  No goals met yet; only 2nd visit.    Personal Factors and Comorbidities Comorbidity 1;Comorbidity 2;Comorbidity 3+    Comorbidities obesity; HTN; chronic pain    Examination-Activity Limitations Locomotion Level;Transfers;Bend;Lift;Squat;Stand;Stairs    Examination-Participation Restrictions Cleaning;Laundry;Shop    Stability/Clinical Decision Making Evolving/Moderate complexity    Rehab Potential Good    PT Frequency 2x / week    PT Duration 6 weeks    PT Treatment/Interventions Patient/family education;ADLs/Self Care Home Management;Cryotherapy;Moist Heat;Ultrasound;Functional mobility training;Gait training;Stair training;Therapeutic activities;Therapeutic exercise;Balance training;Neuromuscular re-education;Manual techniques;Dry needling;Taping    PT Next Visit Plan send recert to add aquatics.  cont back care education and postural strengthening.   patient reports good decrease in pain with prednisone - may be masking true pain levels and functional abilities - reflected in FOTO score and physical findings   PT Home Exercise Plan [email or web address]    Consulted and Agree with Plan of Care Patient           Patient will benefit from skilled therapeutic intervention in order to improve the following deficits and impairments:  Pain, Difficulty walking, Decreased range  of motion, Increased fascial restricitons  Visit Diagnosis: Cervicalgia  Radiculopathy, lumbar region  Muscle weakness (generalized)  Other abnormalities of gait and mobility     Problem List Patient Active Problem List   Diagnosis Date Noted  .  OSA on CPAP 06/29/2019  . Essential hypertension 12/29/2018   [name], PTA 07/12/19 6:20 PM  [facility] [place], [place], [address] Phone: [phone]   Fax:  [phone]  Name: Olivia Hodges MRN: [id number] Date of Birth: May 05, 1975

## 2019-07-12 NOTE — Patient Instructions (Signed)
Access Code: [email or web address]: [email or web address]: 06/17/2021Prepared by: Imogene with Neck Elongation - 3 x daily - 7 x weekly - 1 sets - 5-10 reps - 5 hold  Seated Hip Flexor Stretch - 2 x daily - 7 x weekly - 1 sets - 3 reps - 30 hold  Seated Shoulder W - 1 x daily - 7 x weekly - 3 sets - 10 reps  Doorway Pec Stretch at 90 Degrees Abduction - 2 x daily - 7 x weekly - 1 sets - 2-3 reps - 15-20 hold  Prone on Elbows Stretch - 2 x daily - 7 x weekly - 1 sets - 2 reps - 30-60 hold  Supine Transversus Abdominis Bracing - Hands on Stomach - 1 x daily - 7 x weekly - 1 sets - 10 reps - 5 hold

## 2019-07-16 ENCOUNTER — Other Ambulatory Visit: Payer: Self-pay

## 2019-07-16 ENCOUNTER — Encounter: Payer: Self-pay | Admitting: Physical Therapy

## 2019-07-16 ENCOUNTER — Ambulatory Visit (INDEPENDENT_AMBULATORY_CARE_PROVIDER_SITE_OTHER): Payer: No Typology Code available for payment source | Admitting: Physical Therapy

## 2019-07-16 DIAGNOSIS — M542 Cervicalgia: Secondary | ICD-10-CM | POA: Diagnosis not present

## 2019-07-16 DIAGNOSIS — R2689 Other abnormalities of gait and mobility: Secondary | ICD-10-CM | POA: Diagnosis not present

## 2019-07-16 DIAGNOSIS — M5416 Radiculopathy, lumbar region: Secondary | ICD-10-CM | POA: Diagnosis not present

## 2019-07-16 DIAGNOSIS — M6281 Muscle weakness (generalized): Secondary | ICD-10-CM

## 2019-07-16 NOTE — Therapy (Signed)
[place], [place], [address] Phone: [phone]   Fax:  [phone]  Physical Therapy Treatment  Patient Details  Name: Olivia Hodges MRN: [id number] Date of Birth: 10/21/1975 Referring Provider (PT): Dr Emeterio Reeve    Encounter Date: 07/16/2019   PT End of Session - 07/16/19 1414    Visit Number 3    Number of Visits 12    Date for PT Re-Evaluation 08/20/19    PT Start Time 1351    PT Stop Time 1429    PT Time Calculation (min) 38 min    Activity Tolerance Patient tolerated treatment well           Past Medical History:  Diagnosis Date  . High blood pressure   . Urticaria    (Pressure)    History reviewed. No pertinent surgical history.  There were no vitals filed for this visit.   Subjective Assessment - 07/16/19 1355    Subjective Pt reports her pain is a little worse. She attributes that to finishing her round of Prednisone.    Currently in Pain? Yes    Pain Score 5     Pain Location Hip    Pain Orientation Left    Pain Descriptors / Indicators Aching    Aggravating Factors  standing greater than 2 min    Pain Relieving Factors sitting, medication    Pain Score 3    Pain Location Neck    Pain Orientation Left              [facility] PT Assessment - 07/16/19 0001      Assessment   Medical Diagnosis Cervicalgia; LBP; Lt LE radiculopathy     Referring Provider (PT) Dr Emeterio Reeve     Onset Date/Surgical Date 06/26/18   LBP present x 20 yrs; neck flare up ~ 1 month ago/10 yr hx    Hand Dominance Right    Next MD Visit PRN     Prior Therapy yes; 2017 and 2011              [facility] Adult PT Treatment/Exercise - 07/16/19 0001      Neck Exercises: Supine   Other Supine Exercise scap squeeze x 5 sec x 5 reps       Lumbar Exercises: Stretches   Lower Trunk Rotation 4 reps;20 seconds   limited Rt trunk rotation   Hip Flexor Stretch Right;Left;2 reps;20 seconds    seated with leg back   Hip Flexor Stretch Limitations seated 1st, RLE thomas position x 2 reps    Prone on Elbows Stretch 1 rep;60 seconds    Quad Stretch Left;1 rep;20 seconds   PTA assist in prone   ITB Stretch Left;1 rep;20 seconds   trial in supine; no stretch only pain in Lt groin   Piriformis Stretch Left;2 reps;30 seconds;Right;1 rep;20 seconds   modified pigeon pose on low mat table.      Lumbar Exercises: Aerobic   Nustep L5: 6 min (arms/legs)      Lumbar Exercises: Supine   Bridge 5 reps      Manual Therapy   Manual Therapy Soft tissue mobilization    Soft tissue mobilization STM including TPR to Lt prox quad, Lt glute med and piriformis with contract relax;  STM to Lt hip flexor.           verbally reviewed doorway stretch and W's from HEP.  PT Long Term Goals - 07/09/19 1706      PT LONG TERM GOAL #1   Title Initiate exercise and back care education with patient to verbalize understanding of improtance of exercise on a consistent basis and basic back care principles    Time 6    Period Weeks    Status New    Target Date 08/20/19      PT LONG TERM GOAL #2   Title Increase strength Lt LE to 4+/5 to 5/5    Time 6    Period Weeks    Status New    Target Date 08/20/19      PT LONG TERM GOAL #3   Title Patient to report 25% decrease in frequency, intensity or duration of pain    Time 6    Period Weeks    Status New    Target Date 08/20/19      PT LONG TERM GOAL #4   Title Independent in HEP    Time 6    Period Weeks    Status New    Target Date 08/20/19      PT LONG TERM GOAL #5   Title Establish aquatic exercise program    Time 6    Period Weeks    Status New      Additional Long Term Goals   Additional Long Term Goals Yes      PT LONG TERM GOAL #6   Title Improve FOTO to </= 40% limitation    Time 6    Period Weeks    Status New    Target Date 08/20/19                 Plan - 07/16/19 1401    Clinical  Impression Statement Pt tolerated Thomas and seated Lt hip flexor stretch well.  Point tender in prox ant Lt thigh/groin, glute med and piriformis; improved with TPR to areas.  Pt reported improved pain in Lt hip to 2/10 by end of session. Progressing towards goals.    Personal Factors and Comorbidities Comorbidity 1;Comorbidity 2;Comorbidity 3+    Comorbidities obesity; HTN; chronic pain    Examination-Activity Limitations Locomotion Level;Transfers;Bend;Lift;Squat;Stand;Stairs    Examination-Participation Restrictions Cleaning;Laundry;Shop    Stability/Clinical Decision Making Evolving/Moderate complexity    Rehab Potential Good    PT Frequency 2x / week    PT Duration 6 weeks    PT Treatment/Interventions Patient/family education;ADLs/Self Care Home Management;Cryotherapy;Moist Heat;Ultrasound;Functional mobility training;Gait training;Stair training;Therapeutic activities;Therapeutic exercise;Balance training;Neuromuscular re-education;Manual techniques;Dry needling;Taping;Aquatic Therapy    PT Next Visit Plan --   patient reports good decrease in pain with prednisone - may be masking true pain levels and functional abilities - reflected in FOTO score and physical findings   PT Home Exercise Plan [email or web address]    Consulted and Agree with Plan of Care Patient           Patient will benefit from skilled therapeutic intervention in order to improve the following deficits and impairments:  Pain, Difficulty walking, Decreased range of motion, Increased fascial restricitons  Visit Diagnosis: Radiculopathy, lumbar region  Muscle weakness (generalized)  Other abnormalities of gait and mobility  Cervicalgia     Problem List Patient Active Problem List   Diagnosis Date Noted  . OSA on CPAP 06/29/2019  . Essential hypertension 12/29/2018   [name], PTA 07/16/19 9:41 PM  [place], [place],  [address] Phone: [phone]   Fax:  [phone]  Name: Olivia Hodges MRN: [id number] Date of Birth: 21-Feb-1975

## 2019-07-16 NOTE — Patient Instructions (Signed)
Aquatic Therapy: What to Expect!  Where:  [address], [place]  [address]      [phone]        How to Prepare: . Please make sure you drink 8 ounces of water about one hour prior to your pool session . If you need any assistance with dressing, please have a caregiver with you for the entire appointment. . Please arrive IN YOUR SUIT and 10 minutes prior to your appointment - a health screen will be completed as you enter the [facility].  . Please make sure to attend to any toileting needs prior to entering the pool. . Once on the pool deck your therapist will ask you to sign the Patient  Consent and Assignment of Benefits form. . Your therapist may take your blood pressure prior to, during and after your session if indicated.  About the pool: 1. Entering the pool your therapist will assist you; there are multiple ways to enter including stairs with railings, a walk in ramp, a roll in chair and a mechanical lift. Your therapist will determine the most appropriate way for you. 2. Water temperature is usually between 86-87 degrees. 3. There may be other swimmers in the pool at the same time.   Contact information:      Appointments:  45 minutes [place] Phone [phone] *Please call [facility] if you need to cancel or reschedule an appointment.     Aquatic Therapy:  Keeping Everyone Safe!!!  We are so excited to be back in the pool for therapy and can't wait to see you in the water!! Having said that, we also want to make sure that we keep you, your family member, and everyone one else safe.  We have been in touch with our national aquatic association as well as the CDC to develop safe guidelines for aquatic therapy. First, we want to assure you that the water is one of the safest places to be right now - chlorine and bromine kill the virus and the CDC states the virus  cannot be transmitted in a pool; that's great news for [place]! Below are specific guidelines from the CDC that we will ask you and your caregiver to follow when coming to the [facility] for therapy. 1. Please shower AT HOME and COME IN YOUR SUIT and cover up to the pool. 2. Please ensure that you are ON TIME and ready (meaning that you are in your suit and ready to enter the pool) for your appointment - all of our pool appointments are full and there is a waiting list. If you are late, we cannot extend your time in the pool.   3. Locker rooms are open but limited to 4 people at a time. At the end of your session, you can either change into dry clothes in the locker room or plan to leave in your suit /cover up and change at home. If you require assistance with this, your caregiver will need to provide that assistance.  4. Follow the Aquatic Center's guidelines to use bathroom/locker room facilities. Signs are posted to provide guidelines for you.  5. The [facility] staff will complete a health screen for you and caregiver as you enter the building.  Once this is completed, PLEASE PROCEED DIRECTLY TO THE POOL DECK. We will be waiting for you there! 6. Masks:  your caregiver  must wear a mask at all times. The CDC recommends that patients and therapists wear their masks until we enter the water and then put them back on as we leave the water. PLEASE BRING A PLASTIC BAG TO STORE YOUR MASK IN WHILE WE ARE IN THE POOL.   Additional safety measures: 1. The [employer] will be practicing social distancing, wearing masks and implementing a stringent cleaning program.  2. We will only be using hard surface equipment and we will be cleaning it between all patients. 3. We will be cleaning hand rails used to enter the pool and chairs that your caregiver might use. 4. We as employees of [employer] must complete a screening every day we work prior to our shift. 5. We are only offering aquatic therapy  to patients who have been determined to be at low risk for the virus - we are happy to share that screen with you if you are interested.  We are excited to be able to offer aquatic therapy again in addition to services at our outpatient clinic - thank you for the opportunity to serve you!

## 2019-07-16 NOTE — Addendum Note (Signed)
Addended by: Everardo All on: 07/16/2019 09:20 AM   Modules accepted: Orders

## 2019-07-20 ENCOUNTER — Ambulatory Visit (INDEPENDENT_AMBULATORY_CARE_PROVIDER_SITE_OTHER): Payer: No Typology Code available for payment source | Admitting: Physical Therapy

## 2019-07-20 ENCOUNTER — Encounter: Payer: Self-pay | Admitting: Physical Therapy

## 2019-07-20 DIAGNOSIS — M5416 Radiculopathy, lumbar region: Secondary | ICD-10-CM | POA: Diagnosis not present

## 2019-07-20 DIAGNOSIS — M6281 Muscle weakness (generalized): Secondary | ICD-10-CM | POA: Diagnosis not present

## 2019-07-20 DIAGNOSIS — R2689 Other abnormalities of gait and mobility: Secondary | ICD-10-CM | POA: Diagnosis not present

## 2019-07-20 DIAGNOSIS — M542 Cervicalgia: Secondary | ICD-10-CM | POA: Diagnosis not present

## 2019-07-20 NOTE — Therapy (Signed)
[place], [place], [address] Phone: [phone]   Fax:  [phone]  Physical Therapy Treatment  Patient Details  Name: Olivia Hodges MRN: [id number] Date of Birth: September 30, 1975 Referring Provider (PT): Dr Emeterio Reeve    Encounter Date: 07/20/2019   PT End of Session - 07/20/19 1919    Visit Number 4    Number of Visits 12    Date for PT Re-Evaluation 08/20/19    PT Start Time 1509    PT Stop Time 1558    PT Time Calculation (min) 49 min    Activity Tolerance Patient tolerated treatment well;No increased pain    Behavior During Therapy [facility] for tasks assessed/performed          Subjective:  Pt reports 4/10 pain in bilat SI joints.  Standing makes it worse, sitting makes it better.  She bough a massage gun to work on hip; thinks it is helping.   Stretches have been helping.  She reports 25% improvement since starting therapy.    Past Medical History:  Diagnosis Date  . High blood pressure   . Urticaria    (Pressure)    History reviewed. No pertinent surgical history.  There were no vitals filed for this visit.    Aquatic Therapy at [facility] - pool temp 87 degrees Farenheit.  Pt seen for aquatic therapy today.  Treatment took place in water 3-3.75 ft in depth.  Pt entered/exited the pool via the ramp with single rail and supervision.   Exercises:  * Runner's stretch R/L 30 sec x 2 reps each. Hip flexor stretch with hands on pool rail, legs in lunge position with post pelvic tilt.  Hamstring stretch with foot on bench (in water) with single UE support x 20 sec x 2 reps each leg; cues for form.  *Pt performed gait training in pool forwards 25 m x2. Side stepping with buoyancy cuffs on ankles x 20 meters Rt/ 20 meters Lt - 2 sets, 1 with straight knees 1 in semi-squat position, with arms Abdct/add.  * Ai Chi postures (in semi squat position- water to clavicles): Floating, Uplifting, Enclosing x  10 each for core stabilization and balance. (In single high kneel position): Soothing x 10 reps each side.  * Hip ext x 10 with UE on pool edge; cues for form and core engagement.   Ai Chi: Balancing posture each leg x 10 * Hip flex/ext with buoyancy cuffs on ankles, core engaged x 10 * High knee marching (dynamic) with buoyancy cuffs on ankles 25 m, (with/without reciprocal arm swing under water) * Leg hip circumduction with knee flexed to increase motion x 10 (tactile cues required) * Heel raises x 10 without UE support * Lt SLS x 15 sec x 2, Rt SLS x 15 sec - perturbations provided by water currents      PT Long Term Goals - 07/09/19 1706      PT LONG TERM GOAL #1   Title Initiate exercise and back care education with patient to verbalize understanding of improtance of exercise on a consistent basis and basic back care principles    Time 6    Period Weeks    Status New    Target Date 08/20/19      PT LONG TERM GOAL #2   Title Increase strength Lt LE to 4+/5 to 5/5    Time 6    Period Weeks    Status New    Target  Date 08/20/19      PT LONG TERM GOAL #3   Title Patient to report 25% decrease in frequency, intensity or duration of pain    Time 6    Period Weeks    Status New    Target Date 08/20/19      PT LONG TERM GOAL #4   Title Independent in HEP    Time 6    Period Weeks    Status New    Target Date 08/20/19      PT LONG TERM GOAL #5   Title Establish aquatic exercise program    Time 6    Period Weeks    Status New      Additional Long Term Goals   Additional Long Term Goals Yes      PT LONG TERM GOAL #6   Title Improve FOTO to </= 40% limitation    Time 6    Period Weeks    Status New    Target Date 08/20/19                 Plan - 07/20/19 1923    Clinical Impression Statement Pt reported reduction of pain in low back and sacrum when in pool; intermittent pain in Lt groin dependent on exercise. Decreased SLS on LLE.  Pt requires buoyancy for  support and to offload joints with strengthening exercises.  Reduced gait deviations observed due to reduced joint loading through buoyancy, allowing pt to walk with improved posture, with less pain.  Pt progressing towards goals.    Personal Factors and Comorbidities Comorbidity 1;Comorbidity 2;Comorbidity 3+    Comorbidities obesity; HTN; chronic pain    Examination-Activity Limitations Locomotion Level;Transfers;Bend;Lift;Squat;Stand;Stairs    Examination-Participation Restrictions Cleaning;Laundry;Shop    Stability/Clinical Decision Making Evolving/Moderate complexity    Rehab Potential Good    PT Frequency 2x / week    PT Duration 6 weeks    PT Treatment/Interventions Patient/family education;ADLs/Self Care Home Management;Cryotherapy;Moist Heat;Ultrasound;Functional mobility training;Gait training;Stair training;Therapeutic activities;Therapeutic exercise;Balance training;Neuromuscular re-education;Manual techniques;Dry needling;Taping;Aquatic Therapy    PT Next Visit Plan --   patient reports good decrease in pain with prednisone - may be masking true pain levels and functional abilities - reflected in FOTO score and physical findings   PT Home Exercise Plan [id number]    Consulted and Agree with Plan of Care Patient           Patient will benefit from skilled therapeutic intervention in order to improve the following deficits and impairments:  Pain, Difficulty walking, Decreased range of motion, Increased fascial restricitons  Visit Diagnosis: Radiculopathy, lumbar region  Muscle weakness (generalized)  Other abnormalities of gait and mobility  Cervicalgia     Problem List Patient Active Problem List   Diagnosis Date Noted  . OSA on CPAP 06/29/2019  . Essential hypertension 12/29/2018   [name], PTA 07/20/19 4:28 PM  [place], [place], [address] Phone: [phone]   Fax:   [phone]  Name: Olivia Hodges MRN: [id number] Date of Birth: Nov 26, 1975

## 2019-07-25 ENCOUNTER — Ambulatory Visit (INDEPENDENT_AMBULATORY_CARE_PROVIDER_SITE_OTHER): Payer: No Typology Code available for payment source | Admitting: Physical Therapy

## 2019-07-25 ENCOUNTER — Other Ambulatory Visit: Payer: Self-pay

## 2019-07-25 DIAGNOSIS — M6281 Muscle weakness (generalized): Secondary | ICD-10-CM | POA: Diagnosis not present

## 2019-07-25 DIAGNOSIS — R2689 Other abnormalities of gait and mobility: Secondary | ICD-10-CM | POA: Diagnosis not present

## 2019-07-25 DIAGNOSIS — M5416 Radiculopathy, lumbar region: Secondary | ICD-10-CM

## 2019-07-25 NOTE — Therapy (Signed)
[place], [place], [address] Phone: [phone]   Fax:  [phone]  Physical Therapy Treatment  Patient Details  Name: Olivia Hodges MRN: [id number] Date of Birth: 02-09-1975 Referring Provider (PT): Dr Emeterio Reeve    Encounter Date: 07/25/2019   PT End of Session - 07/25/19 1608    Visit Number 5    Number of Visits 12    Date for PT Re-Evaluation 08/20/19    PT Start Time 1603    PT Stop Time 1645    PT Time Calculation (min) 42 min    Activity Tolerance Patient tolerated treatment well    Behavior During Therapy [facility] for tasks assessed/performed           Past Medical History:  Diagnosis Date  . High blood pressure   . Urticaria    (Pressure)    No past surgical history on file.  There were no vitals filed for this visit.   Subjective Assessment - 07/25/19 1608    Subjective Pt reports she was sore the night of last (aquatic) session (muscle soreness).  She has been sitting in a folding chair much of last week due to company at house.  She has done the stretches but they have only given minimal relief.  She visits surgeon next week (08/01/19). "Today is the worst day I've had in a while".    Patient Stated Goals less pain and a little bit more mobility to avoid surgery    Currently in Pain? Yes    Pain Score 7     Pain Location Back    Pain Orientation Left    Pain Radiating Towards wraps Lt lateral hip to medial Lt knee    Aggravating Factors  standing greater than 2 min    Pain Relieving Factors sitting, medication              [facility] PT Assessment - 07/25/19 0001      Assessment   Medical Diagnosis Cervicalgia; LBP; Lt LE radiculopathy     Referring Provider (PT) Dr Emeterio Reeve     Onset Date/Surgical Date 06/26/18   LBP present x 20 yrs; neck flare up ~ 1 month ago/10 yr hx    Hand Dominance Right    Next MD Visit PRN     Prior Therapy yes; 2017 and 2011        Palpation   SI assessment  Rt PSIS higher than, Lt PSIS lower.   Lt ASIS higher than Rt.  Lt leg appears longer.     Palpation comment point tender along Lt sacral border.              [facility] Adult PT Treatment/Exercise - 07/25/19 0001      Lumbar Exercises: Stretches   Lower Trunk Rotation 4 reps;10 seconds   feet wide, more for hip rotation stretch   Hip Flexor Stretch Left;2 reps;Right;1 rep;30 seconds    Prone on Elbows Stretch 3 reps;20 seconds    Quad Stretch Left;20 seconds;2 reps   PTA assist in prone   Piriformis Stretch Left;2 reps;Right;1 rep;20 seconds   modified table pigeon pose     Lumbar Exercises: Aerobic   Nustep L5: 6 min (arms/legs)      Lumbar Exercises: Supine   Bridge 5 reps    Bridge Limitations limited tolerance      Manual Therapy   Manual Therapy Soft tissue mobilization;Muscle Energy Technique    Manual therapy comments  to decrease fascial restrictions and improve pelvic alignment.     Soft tissue mobilization deep tissue mobilization in Lt glute, piriformis.  STM to bilat lumbar paraspinals     Muscle Energy Technique MET to correct post rotated Lt inominate (in prone with activation of hip flexor)                        PT Long Term Goals - 07/09/19 1706      PT LONG TERM GOAL #1   Title Initiate exercise and back care education with patient to verbalize understanding of improtance of exercise on a consistent basis and basic back care principles    Time 6    Period Weeks    Status New    Target Date 08/20/19      PT LONG TERM GOAL #2   Title Increase strength Lt LE to 4+/5 to 5/5    Time 6    Period Weeks    Status New    Target Date 08/20/19      PT LONG TERM GOAL #3   Title Patient to report 25% decrease in frequency, intensity or duration of pain    Time 6    Period Weeks    Status New    Target Date 08/20/19      PT LONG TERM GOAL #4   Title Independent in HEP    Time 6    Period Weeks    Status New    Target  Date 08/20/19      PT LONG TERM GOAL #5   Title Establish aquatic exercise program    Time 6    Period Weeks    Status New      Additional Long Term Goals   Additional Long Term Goals Yes      PT LONG TERM GOAL #6   Title Improve FOTO to </= 40% limitation    Time 6    Period Weeks    Status New    Target Date 08/20/19                 Plan - 07/25/19 1652    Clinical Impression Statement Pt had some increased muscle soreness after last pool session; resolved by next day. Decreased radicular symptoms with prone press up.  Pt presents with pelvic asymmetries; improved slightly with MET correction.  Continued limited tolerance for standing land exercises.  Pt reported reduction of low back/Left hip pain by end of session.    Personal Factors and Comorbidities Comorbidity 1;Comorbidity 2;Comorbidity 3+    Comorbidities obesity; HTN; chronic pain    Examination-Activity Limitations Locomotion Level;Transfers;Bend;Lift;Squat;Stand;Stairs    Examination-Participation Restrictions Cleaning;Laundry;Shop    Stability/Clinical Decision Making Evolving/Moderate complexity    Rehab Potential Good    PT Frequency 2x / week    PT Duration 6 weeks    PT Treatment/Interventions Patient/family education;ADLs/Self Care Home Management;Cryotherapy;Moist Heat;Ultrasound;Functional mobility training;Gait training;Stair training;Therapeutic activities;Therapeutic exercise;Balance training;Neuromuscular re-education;Manual techniques;Dry needling;Taping;Aquatic Therapy    PT Next Visit Plan assess pelvic alignment, continue MET corrections.  continue aquatic therapy.   patient reports good decrease in pain with prednisone - may be masking true pain levels and functional abilities - reflected in FOTO score and physical findings   PT Home Exercise Plan [id number]    Consulted and Agree with Plan of Care Patient           Patient will benefit from skilled therapeutic intervention in order to improve  the following  deficits and impairments:  Pain, Difficulty walking, Decreased range of motion, Increased fascial restricitons  Visit Diagnosis: Radiculopathy, lumbar region  Muscle weakness (generalized)  Other abnormalities of gait and mobility     Problem List Patient Active Problem List   Diagnosis Date Noted  . OSA on CPAP 06/29/2019  . Essential hypertension 12/29/2018   [name], PTA 07/25/19 4:58 PM  [place], [place], [address] Phone: [phone]   Fax:  [phone]  Name: Lorey Pallett MRN: [id number] Date of Birth: 1975/09/09

## 2019-07-27 ENCOUNTER — Other Ambulatory Visit: Payer: Self-pay

## 2019-07-27 ENCOUNTER — Ambulatory Visit (INDEPENDENT_AMBULATORY_CARE_PROVIDER_SITE_OTHER): Payer: No Typology Code available for payment source | Admitting: Physical Therapy

## 2019-07-27 ENCOUNTER — Encounter: Payer: Self-pay | Admitting: Physical Therapy

## 2019-07-27 DIAGNOSIS — M5416 Radiculopathy, lumbar region: Secondary | ICD-10-CM | POA: Diagnosis not present

## 2019-07-27 DIAGNOSIS — M6281 Muscle weakness (generalized): Secondary | ICD-10-CM | POA: Diagnosis not present

## 2019-07-27 DIAGNOSIS — M542 Cervicalgia: Secondary | ICD-10-CM | POA: Diagnosis not present

## 2019-07-27 DIAGNOSIS — R2689 Other abnormalities of gait and mobility: Secondary | ICD-10-CM | POA: Diagnosis not present

## 2019-07-27 NOTE — Therapy (Signed)
[place], [place], [address] Phone: [phone]   Fax:  [phone]  Physical Therapy Treatment  Patient Details  Name: Delita Chiquito MRN: [id number] Date of Birth: [birth year] Referring Provider (PT): Dr Emeterio Reeve    Encounter Date: 07/27/2019   PT End of Session - 07/27/19 1517    Visit Number 6    Number of Visits 12    Date for PT Re-Evaluation 08/20/19    PT Start Time 0254    PT Stop Time 1600    PT Time Calculation (min) 45 min    Activity Tolerance Patient tolerated treatment well    Behavior During Therapy [facility] for tasks assessed/performed           Past Medical History:  Diagnosis Date  . High blood pressure   . Urticaria    (Pressure)    History reviewed. No pertinent surgical history.  There were no vitals filed for this visit.   Subjective Assessment - 07/27/19 1517    Subjective Pt reports she did well after last session, just some muscle soreness and tightness in Lt groin.  She used massage gun and heat to assist in pain reduction.  She has appt with neurosurgeon and is hopeful to avoid surgery.  She reports her pain has been getting better.    Currently in Pain? Yes    Pain Score 4     Pain Location Back    Pain Orientation Left    Pain Descriptors / Indicators Aching    Pain Radiating Towards wraps to Lt lateral hip.    Multiple Pain Sites No            Aquatic Therapy at [facility] - pool temp 86 degrees Farenheit.  Pt seen for aquatic therapy today.  Treatment took place in water 3.5 ft in depth.  Pt entered/exited the pool via the ramp independently with bilateral rail.   Exercises:  * Forward marching 50 meters, Backward walking 50 meters, side stepping with straight knees Rt/Lt 50 meters each - all without UE assist.  Forward lunge walk x 50 meters, retro lunge walk x 25 meters. High knee marching (dynamic) with 25 m, (with reciprocal arm swing under  water.) * Standing Lt hip flexor stretch x 20 sec x2, Rt x 20 sec (in water);  Rt/Lt hamstring stretch with foot on bench under water x 20 sec x 2 reps each; cues to correct hips.   * Sit to/from stand to bench (in water), with cues for controlled descent and core engaged x 10.   * Hip circles with hip flexed, knee flexed, clockwise x 10 each with single UE support on rail.  * Hip ext and hip abdct with buoyancy cuffs on ankles, UE support on pool ledge, core engaged x 10. * Ai Chi postures: Uplifting, enclosing, soothing, floating - 10 of each with body submerged 75%.  * Split squat stance with core activated and arms holding flotation dumbbells: arms push forward/ pull in to chest x 10, push down into water x 10, push arms out to abdct/ pull in towards armpits x 10. * Plie squats holding dummbell with both hands at core x 10.   Dumbbell at core with mini lower trunk rotation, while legs sustaining squat x 10.         PT Long Term Goals - 07/27/19 1757      PT LONG TERM GOAL #1   Title Initiate exercise and back care  education with patient to verbalize understanding of improtance of exercise on a consistent basis and basic back care principles    Time 6    Period Weeks    Status On-going      PT LONG TERM GOAL #2   Title Increase strength Lt LE to 4+/5 to 5/5    Time 6    Period Weeks    Status On-going      PT LONG TERM GOAL #3   Title Patient to report 25% decrease in frequency, intensity or duration of pain    Time 6    Period Weeks    Status On-going      PT LONG TERM GOAL #4   Title Independent in HEP    Time 6    Period Weeks    Status On-going      PT LONG TERM GOAL #5   Title Establish aquatic exercise program    Time 6    Period Weeks    Status On-going      PT LONG TERM GOAL #6   Title Improve FOTO to </= 40% limitation    Time 6    Period Weeks    Status On-going                 Plan - 07/27/19 1753    Clinical Impression Statement Pt reported  reduction of low back pain when in pool, at times total elimination of pain.  Pt requires buoyancy for support and to offload joints with strengthening exercises. Viscosity of the water is needed for resistance of strengthening; water current perturbations provides challenge to standing balance unsupported, requiring increased core activation.  Pt progressing towards goals.    Personal Factors and Comorbidities Comorbidity 1;Comorbidity 2;Comorbidity 3+    Comorbidities obesity; HTN; chronic pain    Examination-Activity Limitations Locomotion Level;Transfers;Bend;Lift;Squat;Stand;Stairs    Examination-Participation Restrictions Cleaning;Laundry;Shop    Stability/Clinical Decision Making Evolving/Moderate complexity    Rehab Potential Good    PT Frequency 2x / week    PT Duration 6 weeks    PT Treatment/Interventions Patient/family education;ADLs/Self Care Home Management;Cryotherapy;Moist Heat;Ultrasound;Functional mobility training;Gait training;Stair training;Therapeutic activities;Therapeutic exercise;Balance training;Neuromuscular re-education;Manual techniques;Dry needling;Taping;Aquatic Therapy    PT Next Visit Plan assess pelvic alignment, continue MET corrections.  continue aquatic therapy.   patient reports good decrease in pain with prednisone - may be masking true pain levels and functional abilities - reflected in FOTO score and physical findings   PT Home Exercise Plan [id number]    Consulted and Agree with Plan of Care Patient           Patient will benefit from skilled therapeutic intervention in order to improve the following deficits and impairments:  Pain, Difficulty walking, Decreased range of motion, Increased fascial restricitons  Visit Diagnosis: Radiculopathy, lumbar region  Muscle weakness (generalized)  Other abnormalities of gait and mobility  Cervicalgia     Problem List Patient Active Problem List   Diagnosis Date Noted  . OSA on CPAP 06/29/2019  .  Essential hypertension 12/29/2018   [name], PTA 07/27/19 5:58 PM  [place], [place], [address] Phone: [phone]   Fax:  [phone]  Name: Nene Aranas MRN: [id number] Date of Birth: 12-03-1975

## 2019-08-02 ENCOUNTER — Encounter: Payer: Self-pay | Admitting: Physical Therapy

## 2019-08-02 DIAGNOSIS — M542 Cervicalgia: Secondary | ICD-10-CM | POA: Insufficient documentation

## 2019-08-02 DIAGNOSIS — M5416 Radiculopathy, lumbar region: Secondary | ICD-10-CM | POA: Insufficient documentation

## 2019-08-02 DIAGNOSIS — Z6841 Body Mass Index (BMI) 40.0 and over, adult: Secondary | ICD-10-CM | POA: Diagnosis present

## 2019-08-03 ENCOUNTER — Encounter (INDEPENDENT_AMBULATORY_CARE_PROVIDER_SITE_OTHER): Payer: No Typology Code available for payment source | Admitting: Osteopathic Medicine

## 2019-08-03 DIAGNOSIS — I10 Essential (primary) hypertension: Secondary | ICD-10-CM

## 2019-08-03 MED ORDER — HYDROCHLOROTHIAZIDE 12.5 MG PO TABS
12.5000 mg | ORAL_TABLET | Freq: Every day | ORAL | 0 refills | Status: DC
Start: 2019-08-03 — End: 2019-10-29

## 2019-08-03 NOTE — Telephone Encounter (Signed)
5 mins spent Minimal MDM Billed appropriately

## 2019-08-07 ENCOUNTER — Other Ambulatory Visit: Payer: Self-pay | Admitting: Neurological Surgery

## 2019-08-07 ENCOUNTER — Other Ambulatory Visit (HOSPITAL_COMMUNITY): Payer: Self-pay | Admitting: Neurological Surgery

## 2019-08-07 DIAGNOSIS — M4317 Spondylolisthesis, lumbosacral region: Secondary | ICD-10-CM

## 2019-08-09 ENCOUNTER — Other Ambulatory Visit: Payer: Self-pay

## 2019-08-09 ENCOUNTER — Encounter: Payer: Self-pay | Admitting: Physical Therapy

## 2019-08-09 ENCOUNTER — Ambulatory Visit (INDEPENDENT_AMBULATORY_CARE_PROVIDER_SITE_OTHER): Payer: No Typology Code available for payment source | Admitting: Physical Therapy

## 2019-08-09 DIAGNOSIS — M542 Cervicalgia: Secondary | ICD-10-CM

## 2019-08-09 DIAGNOSIS — R2689 Other abnormalities of gait and mobility: Secondary | ICD-10-CM | POA: Diagnosis not present

## 2019-08-09 DIAGNOSIS — M6281 Muscle weakness (generalized): Secondary | ICD-10-CM | POA: Diagnosis not present

## 2019-08-09 DIAGNOSIS — M5416 Radiculopathy, lumbar region: Secondary | ICD-10-CM | POA: Diagnosis not present

## 2019-08-09 NOTE — Therapy (Signed)
[place], [place], [address] Phone: [phone]   Fax:  [phone]  Physical Therapy Treatment  Patient Details  Name: Olivia Hodges MRN: [id number] Date of Birth: 01/20/1976 Referring Provider (PT): Dr Emeterio Reeve    Encounter Date: 08/09/2019   PT End of Session - 08/09/19 1712    Visit Number 7    Number of Visits 12    Date for PT Re-Evaluation 08/20/19    PT Start Time 5110    PT Stop Time 1745    PT Time Calculation (min) 41 min    Activity Tolerance Patient tolerated treatment well    Behavior During Therapy [facility] for tasks assessed/performed           Past Medical History:  Diagnosis Date  . High blood pressure   . Urticaria    (Pressure)    History reviewed. No pertinent surgical history.  There were no vitals filed for this visit.   Subjective Assessment - 08/09/19 1709    Subjective "It's a good day".   Pt reports she saw the neurosurgeon this week; will have CAT scan Monday and a ESI August 4.  She reports she recently felt a clunk in Lt SI joint and had relief; feels her LLE isn't as long now.    Pertinent History neck pain x 20 yrs; LBP x 10 yrs; HTN; arthritis; sleep apnea; headaches related to cervical dysfunction    Currently in Pain? Yes    Pain Score 4     Pain Location Back    Pain Orientation Left;Right;Lower    Pain Descriptors / Indicators Dull    Aggravating Factors  standing greater than 2 min    Pain Relieving Factors sitting, medication              [facility] PT Assessment - 08/09/19 0001      Assessment   Medical Diagnosis Cervicalgia; LBP; Lt LE radiculopathy     Referring Provider (PT) Dr Emeterio Reeve     Onset Date/Surgical Date 06/26/18   LBP present x 20 yrs; neck flare up ~ 1 month ago/10 yr hx    Hand Dominance Right    Next MD Visit PRN     Prior Therapy yes; 2017 and 2011       Strength   Right Hip ABduction --   5-/5   Left Hip  ABduction 4/5      Palpation   SI assessment  Rt sacral torsion             [facility] Adult PT Treatment/Exercise - 08/09/19 0001      Lumbar Exercises: Stretches   Prone on Elbows Stretch 5 reps;20 seconds      Lumbar Exercises: Aerobic   Nustep L5: 5.5 min (arms/legs)      Lumbar Exercises: Supine   Bridge with Ball Squeeze 5 reps;5 seconds   2 sets   Bridge with [employer] Limitations improved tolerance      Lumbar Exercises: Prone   Single Arm Raise Right;Left;5 reps   2 sets   Single Arm Raises Limitations Pain in Lt ant hip with RLE hip ext - switched to toes tucked and Rt TKE with Lt arm raise.       Manual Therapy   Soft tissue mobilization deep tissue mobilization in Lt/Rt  glute & piriformis    Muscle Energy Technique MET to correct post rotated Lt inominate (in prone with activation of hip flexor) ;  MET to correct elevated Rt ilium (contract relax of Rt QL in Lt sidelying)                       PT Long Term Goals - 07/27/19 1757      PT LONG TERM GOAL #1   Title Initiate exercise and back care education with patient to verbalize understanding of improtance of exercise on a consistent basis and basic back care principles    Time 6    Period Weeks    Status On-going      PT LONG TERM GOAL #2   Title Increase strength Lt LE to 4+/5 to 5/5    Time 6    Period Weeks    Status On-going      PT LONG TERM GOAL #3   Title Patient to report 25% decrease in frequency, intensity or duration of pain    Time 6    Period Weeks    Status On-going      PT LONG TERM GOAL #4   Title Independent in HEP    Time 6    Period Weeks    Status On-going      PT LONG TERM GOAL #5   Title Establish aquatic exercise program    Time 6    Period Weeks    Status On-going      PT LONG TERM GOAL #6   Title Improve FOTO to </= 40% limitation    Time 6    Period Weeks    Status On-going                 Plan - 08/09/19 1753    Clinical Impression  Statement Pt reporting gradual improvement in symptoms.  Pt demonstrated improved tolerance for exercises today, with no increase in LBP with bridge.  Pt reported slight decrease in pain to 3/10 at end of session.  Progressing gradually towards goals.    Personal Factors and Comorbidities Comorbidity 1;Comorbidity 2;Comorbidity 3+    Comorbidities obesity; HTN; chronic pain    Examination-Activity Limitations Locomotion Level;Transfers;Bend;Lift;Squat;Stand;Stairs    Examination-Participation Restrictions Cleaning;Laundry;Shop    Stability/Clinical Decision Making Evolving/Moderate complexity    Rehab Potential Good    PT Frequency 2x / week    PT Duration 6 weeks    PT Treatment/Interventions Patient/family education;ADLs/Self Care Home Management;Cryotherapy;Moist Heat;Ultrasound;Functional mobility training;Gait training;Stair training;Therapeutic activities;Therapeutic exercise;Balance training;Neuromuscular re-education;Manual techniques;Dry needling;Taping;Aquatic Therapy    PT Next Visit Plan assess pelvic alignment, continue MET corrections.  continue aquatic therapy.   patient reports good decrease in pain with prednisone - may be masking true pain levels and functional abilities - reflected in FOTO score and physical findings   PT Home Exercise Plan [email or web address]    Consulted and Agree with Plan of Care Patient           Patient will benefit from skilled therapeutic intervention in order to improve the following deficits and impairments:  Pain, Difficulty walking, Decreased range of motion, Increased fascial restricitons  Visit Diagnosis: Radiculopathy, lumbar region  Muscle weakness (generalized)  Other abnormalities of gait and mobility  Cervicalgia     Problem List Patient Active Problem List   Diagnosis Date Noted  . OSA on CPAP 06/29/2019  . Essential hypertension 12/29/2018   [name], PTA 08/09/19 5:59 PM  [facility]  [place], [place], [address] Phone: [phone]   Fax:  [phone]  Name: Brianda Beitler MRN: [id number] Date  of Birth: 18-Mar-1975

## 2019-08-10 ENCOUNTER — Ambulatory Visit (INDEPENDENT_AMBULATORY_CARE_PROVIDER_SITE_OTHER): Payer: No Typology Code available for payment source | Admitting: Physical Therapy

## 2019-08-10 ENCOUNTER — Encounter: Payer: Self-pay | Admitting: Physical Therapy

## 2019-08-10 DIAGNOSIS — R2689 Other abnormalities of gait and mobility: Secondary | ICD-10-CM

## 2019-08-10 DIAGNOSIS — M6281 Muscle weakness (generalized): Secondary | ICD-10-CM

## 2019-08-10 DIAGNOSIS — M5416 Radiculopathy, lumbar region: Secondary | ICD-10-CM | POA: Diagnosis not present

## 2019-08-10 DIAGNOSIS — M542 Cervicalgia: Secondary | ICD-10-CM | POA: Diagnosis not present

## 2019-08-10 NOTE — Therapy (Signed)
[place], [place], [address] Phone: [phone]   Fax:  [phone]  Physical Therapy Treatment  Patient Details  Name: Olivia Hodges MRN: [id number] Date of Birth: Aug 19, 1975 Referring Provider (PT): Dr Emeterio Reeve    Encounter Date: 08/10/2019   PT End of Session - 08/10/19 1911    Visit Number 8    Number of Visits 12    Date for PT Re-Evaluation 08/20/19    PT Start Time 9798    PT Stop Time 1530    PT Time Calculation (min) 45 min           Past Medical History:  Diagnosis Date  . High blood pressure   . Urticaria    (Pressure)    History reviewed. No pertinent surgical history.  There were no vitals filed for this visit.   Subjective Assessment - 08/10/19 1912    Subjective Pt reports she is a little sore in Lt ant hip from yesterday's PT session, but "still not too bad".  Pt states she has scheduling conflicts for upcoming aquatic therapy appts; will plan further PT appts after results of CAT scan.    Pertinent History neck pain x 20 yrs; LBP x 10 yrs; HTN; arthritis; sleep apnea; headaches related to cervical dysfunction    Patient Stated Goals less pain and a little bit more mobility to avoid surgery    Currently in Pain? Yes    Pain Score 5     Pain Location Hip    Pain Orientation Left;Anterior    Pain Descriptors / Indicators Aching              [facility] PT Assessment - 08/10/19 0001      Assessment   Medical Diagnosis Cervicalgia; LBP; Lt LE radiculopathy     Referring Provider (PT) Dr Emeterio Reeve     Onset Date/Surgical Date 06/26/18   LBP present x 20 yrs; neck flare up ~ 1 month ago/10 yr hx    Hand Dominance Right    Next MD Visit PRN     Prior Therapy yes; 2017 and 2011            Pt seen for aquatic therapy today.  Treatment took place in water 3.5-4 ft in depth at [facility] Temp of water was 86.  Pt entered/exited the pool  independently via ramp with bilat rail.  Treatment:   Sit to/from stand to bench in water x 10 reps with cues for core engagement.  Rt/Lt hip flexor, calf, and hamstring stretches with UE support on side of pool x 20 sec each x 2 reps each leg. Side step with squat and arms pressing down into water upon return to surface x 40 meters Rt/Lt.  Retro gait with core engaged, reciprocal arms then arms pressing down into water every other step - 45 meters, 100 meters.   With ankle buoyancy cuffs on: Rt/Lt hip abdct x 20, hip ext x 20, hip flex x 10.  Hip ER/IR with knee flexion x 10 each.   Squat with row of arms holding dumbbells slightly submerged -  forward x15, to bilat side x 15.  Ai Chi postures:  Floating, Enclosing, Folding, Soothing, Gathering, Freeing - 5 reps each posture.  Pec stretch - 120 shoulder abdct holding rail x 20 sec x 2 reps each side.    Pt requires buoyancy for support and to offload joints with strengthening exercises. Viscosity of the water  is needed for resistance of strengthening; water current perturbations provides challenge to standing balance unsupported, requiring increased core activation.     PT Long Term Goals - 07/27/19 1757      PT LONG TERM GOAL #1   Title Initiate exercise and back care education with patient to verbalize understanding of improtance of exercise on a consistent basis and basic back care principles    Time 6    Period Weeks    Status On-going      PT LONG TERM GOAL #2   Title Increase strength Lt LE to 4+/5 to 5/5    Time 6    Period Weeks    Status On-going      PT LONG TERM GOAL #3   Title Patient to report 25% decrease in frequency, intensity or duration of pain    Time 6    Period Weeks    Status On-going      PT LONG TERM GOAL #4   Title Independent in HEP    Time 6    Period Weeks    Status On-going      PT LONG TERM GOAL #5   Title Establish aquatic exercise program    Time 6    Period Weeks    Status On-going       PT LONG TERM GOAL #6   Title Improve FOTO to </= 40% limitation    Time 6    Period Weeks    Status On-going                 Plan - 08/10/19 1915    Clinical Impression Statement Pt reported some increase in Lt ant hip pain with standing hip abdct/ext; pain reduced with reduction of range of leg motion with exercises.  Pt otherwise tolerated all other exercises well. Progressing gradually towards goals.    Personal Factors and Comorbidities Comorbidity 1;Comorbidity 2;Comorbidity 3+    Comorbidities obesity; HTN; chronic pain    Examination-Activity Limitations Locomotion Level;Transfers;Bend;Lift;Squat;Stand;Stairs    Examination-Participation Restrictions Cleaning;Laundry;Shop    Stability/Clinical Decision Making Evolving/Moderate complexity    Rehab Potential Good    PT Frequency 2x / week    PT Duration 6 weeks    PT Treatment/Interventions Patient/family education;ADLs/Self Care Home Management;Cryotherapy;Moist Heat;Ultrasound;Functional mobility training;Gait training;Stair training;Therapeutic activities;Therapeutic exercise;Balance training;Neuromuscular re-education;Manual techniques;Dry needling;Taping;Aquatic Therapy    PT Next Visit Plan assess pelvic alignment, continue MET corrections.  continue aquatic therapy. Assess progress of goals.   PT Home Exercise Plan [email or web address]    Consulted and Agree with Plan of Care Patient           Patient will benefit from skilled therapeutic intervention in order to improve the following deficits and impairments:  Pain, Difficulty walking, Decreased range of motion, Increased fascial restricitons  Visit Diagnosis: Radiculopathy, lumbar region  Muscle weakness (generalized)  Other abnormalities of gait and mobility  Cervicalgia     Problem List Patient Active Problem List   Diagnosis Date Noted  . OSA on CPAP 06/29/2019  . Essential hypertension 12/29/2018   [name], PTA 08/10/19 7:27 PM  [place], [place], [address] Phone: [phone]   Fax:  [phone]  Name: Olivia Hodges MRN: [id number] Date of Birth: 04/16/1975

## 2019-08-13 ENCOUNTER — Other Ambulatory Visit: Payer: Self-pay

## 2019-08-13 ENCOUNTER — Ambulatory Visit (INDEPENDENT_AMBULATORY_CARE_PROVIDER_SITE_OTHER): Payer: No Typology Code available for payment source

## 2019-08-13 DIAGNOSIS — M4317 Spondylolisthesis, lumbosacral region: Secondary | ICD-10-CM | POA: Diagnosis not present

## 2019-08-15 ENCOUNTER — Other Ambulatory Visit: Payer: Self-pay

## 2019-08-15 ENCOUNTER — Ambulatory Visit (INDEPENDENT_AMBULATORY_CARE_PROVIDER_SITE_OTHER): Payer: No Typology Code available for payment source | Admitting: Rehabilitative and Restorative Service Providers"

## 2019-08-15 ENCOUNTER — Encounter: Payer: Self-pay | Admitting: Rehabilitative and Restorative Service Providers"

## 2019-08-15 DIAGNOSIS — M5416 Radiculopathy, lumbar region: Secondary | ICD-10-CM | POA: Diagnosis not present

## 2019-08-15 DIAGNOSIS — R2689 Other abnormalities of gait and mobility: Secondary | ICD-10-CM

## 2019-08-15 DIAGNOSIS — M542 Cervicalgia: Secondary | ICD-10-CM | POA: Diagnosis not present

## 2019-08-15 DIAGNOSIS — M6281 Muscle weakness (generalized): Secondary | ICD-10-CM | POA: Diagnosis not present

## 2019-08-15 NOTE — Therapy (Signed)
[place], [place], [address] Phone: [phone]   Fax:  [phone]  Physical Therapy Treatment  Patient Details  Name: Olivia Hodges MRN: [id number] Date of Birth: 1975/06/03 Referring Provider (PT): Dr Emeterio Reeve    Encounter Date: 08/15/2019   PT End of Session - 08/15/19 1606    Visit Number 9    Number of Visits 12    Date for PT Re-Evaluation 08/20/19    PT Start Time 1600    PT Stop Time 1640    PT Time Calculation (min) 40 min    Activity Tolerance Patient tolerated treatment well           Past Medical History:  Diagnosis Date  . High blood pressure   . Urticaria    (Pressure)    History reviewed. No pertinent surgical history.  There were no vitals filed for this visit.   Subjective Assessment - 08/15/19 1607    Subjective Not having as much pain in the hip as well as down into the Lt LE; catch is not as severe in the Lt groin/hip area. She is able to do more standing and walking. Planning a trip to [place] next Thursday for a couple of days.    Currently in Pain? Yes    Pain Score 3     Pain Location Hip    Pain Orientation Left;Anterior    Pain Descriptors / Indicators Aching              [facility] PT Assessment - 08/15/19 0001      Assessment   Medical Diagnosis Cervicalgia; LBP; Lt LE radiculopathy     Referring Provider (PT) Dr Emeterio Reeve     Onset Date/Surgical Date 06/26/18   LBP present x 20 yrs; neck flare up ~ 1 month ago/10 yr hx    Hand Dominance Right    Next MD Visit PRN     Prior Therapy yes; 2017 and 2011       Posture/Postural Control   Posture Comments rounded forward head and shoulder; flexed forward at hips; Rt LE straight w/ knee hyperextended Lt LE flexed at hip and knee       Special Tests   Other special tests Leg length - Rt ASIS to medial malleolus 87 cm; Lt 88 cm; greater trochanter to medial malleolus Rt 84.6 cm; Lt 85.1 cm  (patient palpating for proximal landmarks. Supine to sit test shows no leg length difference       Ambulation/Gait   Ambulation Distance (Feet) 40 Feet    Assistive device None    Gait Pattern Step-through pattern;Decreased weight shift to left;Left circumduction;Left flexed knee in stance;Right genu recurvatum    Gait Comments ambulates with limp Lt LE with wt bearing Lt; gait improved with 3/8 inch lift Rt shoe                          [facility] Adult PT Treatment/Exercise - 08/15/19 0001      Lumbar Exercises: Stretches   Prone on Elbows Stretch 5 reps;20 seconds      Lumbar Exercises: Aerobic   Nustep L5: 5.5 min (arms 9 /legs)      Lumbar Exercises: Standing   Row Strengthening;Both;20 reps;Theraband    Theraband Level (Row) Level 4 (Blue)    Shoulder Extension Strengthening;Both;20 reps;Theraband    Theraband Level (Shoulder Extension) Level 4 (Blue)    Other Standing Lumbar Exercises  gait w/ heel lift Rt shoe - working on improved gait pattern with hip and knee extension 80 ft x 5       Lumbar Exercises: Seated   Sit to Stand 15 reps   slow eccentric stand to sit in last 5 reps      Lumbar Exercises: Prone   Single Arm Raise Right;Left;10 reps;3 seconds   opposite arm and leg alternating                  PT Education - 08/15/19 1639    Education Details HEP    Person(s) Educated Patient    Methods Explanation;Demonstration;Tactile cues;Verbal cues;Handout    Comprehension Verbalized understanding;Returned demonstration;Verbal cues required;Tactile cues required               PT Long Term Goals - 07/27/19 1757      PT LONG TERM GOAL #1   Title Initiate exercise and back care education with patient to verbalize understanding of improtance of exercise on a consistent basis and basic back care principles    Time 6    Period Weeks    Status On-going      PT LONG TERM GOAL #2   Title Increase strength Lt LE to 4+/5 to 5/5    Time 6    Period  Weeks    Status On-going      PT LONG TERM GOAL #3   Title Patient to report 25% decrease in frequency, intensity or duration of pain    Time 6    Period Weeks    Status On-going      PT LONG TERM GOAL #4   Title Independent in HEP    Time 6    Period Weeks    Status On-going      PT LONG TERM GOAL #5   Title Establish aquatic exercise program    Time 6    Period Weeks    Status On-going      PT LONG TERM GOAL #6   Title Improve FOTO to </= 40% limitation    Time 6    Period Weeks    Status On-going                 Plan - 08/15/19 1631    Clinical Impression Statement Gradual improvement in Lt hip pain and discomfort. Patient feels more level and can stand without bending Lt knee with heel lift Rt LE. Added 3/8 inch lift Rt shoe with improved standing alignment and less Lt knee flexion in standing and with gait.  Added core stabilization exercises without difficulty.    Rehab Potential Good    PT Frequency 2x / week    PT Duration 6 weeks    PT Treatment/Interventions Patient/family education;ADLs/Self Care Home Management;Cryotherapy;Moist Heat;Ultrasound;Functional mobility training;Gait training;Stair training;Therapeutic activities;Therapeutic exercise;Balance training;Neuromuscular re-education;Manual techniques;Dry needling;Taping;Aquatic Therapy    PT Next Visit Plan assess pelvic alignment, continue MET corrections.  continue aquatic therapy assess response to Rt heel lift and core stabilization    PT Home Exercise Plan [email or web address]    Consulted and Agree with Plan of Care Patient           Patient will benefit from skilled therapeutic intervention in order to improve the following deficits and impairments:     Visit Diagnosis: Radiculopathy, lumbar region  Muscle weakness (generalized)  Other abnormalities of gait and mobility  Cervicalgia     Problem List Patient Active Problem List   Diagnosis Date Noted  .  OSA on CPAP 06/29/2019  . Essential  hypertension 12/29/2018    Chayden Garrelts Nilda Simmer PT, MPH  08/15/2019, 4:54 PM  [facility] [place], [place], [address] Phone: [phone]   Fax:  [phone]  Name: Olivia Hodges MRN: [id number] Date of Birth: 07/05/1975

## 2019-08-15 NOTE — Patient Instructions (Signed)
Access Code: [email or web address]: 07/21/2021Prepared by: Selenne Coggin HoltExercises  Supine Transversus Abdominis Bracing with Pelvic Floor Contraction - 2 x daily - 7 x weekly - 1 sets - 10 reps - 10sec hold  Prone Press Up on Elbows - 2 x daily - 7 x weekly - 1 sets - 3 reps - 30 sec hold  Sit to Stand - 2 x daily - 7 x weekly - 1 sets - 10 reps - 3-5 sec hold  Standing Bilateral Low Shoulder Row with Anchored Resistance - 2 x daily - 7 x weekly - 1-3 sets - 2-3 reps - 10 sec hold  Shoulder Extension with Resistance - 2 x daily - 7 x weekly - 1-3 sets - 10 reps - 2-3 sec hold

## 2019-08-16 ENCOUNTER — Ambulatory Visit (INDEPENDENT_AMBULATORY_CARE_PROVIDER_SITE_OTHER): Payer: No Typology Code available for payment source | Admitting: Rehabilitative and Restorative Service Providers"

## 2019-08-16 ENCOUNTER — Encounter: Payer: Self-pay | Admitting: Rehabilitative and Restorative Service Providers"

## 2019-08-16 ENCOUNTER — Other Ambulatory Visit: Payer: Self-pay

## 2019-08-16 DIAGNOSIS — M5416 Radiculopathy, lumbar region: Secondary | ICD-10-CM

## 2019-08-16 DIAGNOSIS — R2689 Other abnormalities of gait and mobility: Secondary | ICD-10-CM

## 2019-08-16 DIAGNOSIS — M542 Cervicalgia: Secondary | ICD-10-CM

## 2019-08-16 DIAGNOSIS — M6281 Muscle weakness (generalized): Secondary | ICD-10-CM

## 2019-08-16 NOTE — Therapy (Signed)
[place], [place], [address] Phone: [phone]   Fax:  [phone]  Physical Therapy Treatment  Patient Details  Name: Olivia Hodges MRN: [id number] Date of Birth: [birth year] Referring Provider (PT): Dr Emeterio Reeve    Encounter Date: 08/16/2019   PT End of Session - 08/16/19 1439    Visit Number 10    Number of Visits 12    Date for PT Re-Evaluation 08/20/19    PT Start Time 8882    PT Stop Time 1521    PT Time Calculation (min) 42 min    Activity Tolerance Patient tolerated treatment well           Past Medical History:  Diagnosis Date  . High blood pressure   . Urticaria    (Pressure)    History reviewed. No pertinent surgical history.  There were no vitals filed for this visit.   Subjective Assessment - 08/16/19 1441    Subjective Thinks the heel lift is helpful. Feels like she does not wobble as much when she walks. Will be out of town the end of next week - going to [place].    Currently in Pain? Yes    Pain Score 6     Pain Location Hip    Pain Orientation Left;Anterior    Pain Descriptors / Indicators Aching    Pain Type Chronic pain    Pain Onset More than a month ago    Pain Frequency Constant    Aggravating Factors  sometimes can stand for 5 min    Pain Relieving Factors sitting; meds                             [facility] Adult PT Treatment/Exercise - 08/16/19 0001      Ambulation/Gait   Ambulation Distance (Feet) 120 Feet    Assistive device None    Gait Pattern Step-through pattern;Decreased weight shift to left;Left circumduction;Left flexed knee in stance;Right genu recurvatum    Gait Comments decreased to 1/4 inch lift which felt better to pt and continued to work on gait with good wt shift and Lt knee extension       Lumbar Exercises: Stretches   Hip Flexor Stretch Left;3 reps;Right;2 reps;30 seconds    Prone on Elbows Stretch 3 reps;30  seconds    Press Ups 10 reps   2-3 sec    Quad Stretch Left;20 seconds;2 reps   PT assist    Other Lumbar Stretch Exercise hip flexor stretch standing with strap around posterior hips pt leaning back ~ 20-30 sec trial       Lumbar Exercises: Aerobic   Nustep L5: 6 min (arms 8 /legs)      Lumbar Exercises: Standing   Row Strengthening;Both;20 reps;Theraband    Theraband Level (Row) Level 4 (Blue)    Shoulder Extension Strengthening;Both;20 reps;Theraband    Theraband Level (Shoulder Extension) Level 4 (Blue)      Lumbar Exercises: Seated   Sit to Stand 15 reps   slow eccentric stand to sit in last 5 reps      Lumbar Exercises: Prone   Single Arm Raise Right;Left;10 reps;3 seconds   opposite arm and leg alternating      Manual Therapy   Soft tissue mobilization deep tissue mobilization in Lt/Rt  glute & piriformis  PT Long Term Goals - 07/27/19 1757      PT LONG TERM GOAL #1   Title Initiate exercise and back care education with patient to verbalize understanding of improtance of exercise on a consistent basis and basic back care principles    Time 6    Period Weeks    Status On-going      PT LONG TERM GOAL #2   Title Increase strength Lt LE to 4+/5 to 5/5    Time 6    Period Weeks    Status On-going      PT LONG TERM GOAL #3   Title Patient to report 25% decrease in frequency, intensity or duration of pain    Time 6    Period Weeks    Status On-going      PT LONG TERM GOAL #4   Title Independent in HEP    Time 6    Period Weeks    Status On-going      PT LONG TERM GOAL #5   Title Establish aquatic exercise program    Time 6    Period Weeks    Status On-going      PT LONG TERM GOAL #6   Title Improve FOTO to </= 40% limitation    Time 6    Period Weeks    Status On-going                 Plan - 08/16/19 1440    Clinical Impression Statement Good response to heel lift with improved gait pattern and standing  alignment. Decreased lift to 1/4 inch with improvement in how it felt. Continued with stretching and strengthening as well as soft tissue work thorugh posterior hips/piriformis gluts    Rehab Potential Good    PT Frequency 2x / week    PT Duration 6 weeks    PT Treatment/Interventions Patient/family education;ADLs/Self Care Home Management;Cryotherapy;Moist Heat;Ultrasound;Functional mobility training;Gait training;Stair training;Therapeutic activities;Therapeutic exercise;Balance training;Neuromuscular re-education;Manual techniques;Dry needling;Taping;Aquatic Therapy    PT Next Visit Plan assess pelvic alignment and continue MET corrections as indicated.  continue aquatic therapy as schedule allows; assess response to Rt heel lift and core stabilization    PT Home Exercise Plan [email or web address]    Consulted and Agree with Plan of Care Patient           Patient will benefit from skilled therapeutic intervention in order to improve the following deficits and impairments:     Visit Diagnosis: Radiculopathy, lumbar region  Muscle weakness (generalized)  Other abnormalities of gait and mobility  Cervicalgia     Problem List Patient Active Problem List   Diagnosis Date Noted  . OSA on CPAP 06/29/2019  . Essential hypertension 12/29/2018    Olivia Hodges Olivia Hodges PT, MPH  08/16/2019, 3:30 PM  [facility] [place], [place], [address] Phone: [phone]   Fax:  [phone]  Name: Olivia Hodges MRN: [id number] Date of Birth: [birth year]

## 2019-08-20 ENCOUNTER — Ambulatory Visit (INDEPENDENT_AMBULATORY_CARE_PROVIDER_SITE_OTHER): Payer: No Typology Code available for payment source | Admitting: Physical Therapy

## 2019-08-20 ENCOUNTER — Encounter: Payer: Self-pay | Admitting: Physical Therapy

## 2019-08-20 ENCOUNTER — Other Ambulatory Visit: Payer: Self-pay

## 2019-08-20 DIAGNOSIS — M5416 Radiculopathy, lumbar region: Secondary | ICD-10-CM

## 2019-08-20 DIAGNOSIS — R2689 Other abnormalities of gait and mobility: Secondary | ICD-10-CM | POA: Diagnosis not present

## 2019-08-20 DIAGNOSIS — M6281 Muscle weakness (generalized): Secondary | ICD-10-CM

## 2019-08-20 DIAGNOSIS — M542 Cervicalgia: Secondary | ICD-10-CM

## 2019-08-20 NOTE — Therapy (Signed)
[place], [place], [address] Phone: [phone]   Fax:  [phone]  Physical Therapy Treatment  Patient Details  Name: Olivia Hodges MRN: [id number] Date of Birth: [birth year] Referring Provider (PT): Dr Emeterio Reeve    Encounter Date: 08/20/2019   PT End of Session - 08/20/19 1608    Visit Number 11    Number of Visits 24    Date for PT Re-Evaluation 10/01/19    PT Start Time 1603    PT Stop Time 1642    PT Time Calculation (min) 39 min    Activity Tolerance Patient tolerated treatment well    Behavior During Therapy [facility] for tasks assessed/performed           Past Medical History:  Diagnosis Date  . High blood pressure   . Urticaria    (Pressure)    History reviewed. No pertinent surgical history.  There were no vitals filed for this visit.   Subjective Assessment - 08/20/19 1614    Subjective Pt reports persistant pain in Lt groin.  Heel lift has helped. Pt reports she is feeling improvement; "I'm not as miserable" when she does activities.    Pertinent History neck pain x 20 yrs; LBP x 10 yrs; HTN; arthritis; sleep apnea; headaches related to cervical dysfunction    Currently in Pain? Yes    Pain Score 4     Pain Location Groin    Pain Orientation Left    Pain Descriptors / Indicators Aching    Pain Onset More than a month ago              [facility] PT Assessment - 08/20/19 0001      Assessment   Medical Diagnosis Cervicalgia; LBP; Lt LE radiculopathy     Referring Provider (PT) Dr Emeterio Reeve     Onset Date/Surgical Date 06/26/18   LBP present x 20 yrs; neck flare up ~ 1 month ago/10 yr hx    Hand Dominance Right    Next MD Visit PRN     Prior Therapy yes; 2017 and 2011       Observation/Other Assessments   Focus on Therapeutic Outcomes (FOTO)  50% limited       Strength   Right Hip Flexion 5/5    Right Hip Extension 5/5    Right Hip ABduction 5/5    Left  Hip Flexion 5/5    Left Hip Extension 5/5    Left Hip ABduction 5/5           [facility] Adult PT Treatment/Exercise - 08/20/19 0001      Neck Exercises: Supine   Other Supine Exercise bilat shoulder ER with blue band x 10 reps      Lumbar Exercises: Stretches   Hip Flexor Stretch Left;2 reps;30 seconds;Right;1 rep;20 seconds    Prone on Elbows Stretch 2 reps;30 seconds    Quad Stretch Left;2 reps;30 seconds   pool noodle above knee.    Other Lumbar Stretch Exercise mid and high doorway stretch x 20 sec x 3 reps      Lumbar Exercises: Aerobic   Nustep L5: 7 min (arms 8 /legs)      Lumbar Exercises: Standing   Row Strengthening;Right;Left;10 reps    Theraband Level (Row) Level 4 (Blue)    Row Limitations (bow and arrow motion)    Other Standing Lumbar Exercises anti--rotation with blue band x 10 reps (lateral stepping with band at core)  Lumbar Exercises: Supine   Bridge 5 reps   improved tolerance   Bridge with Ball Squeeze 5 reps;5 seconds      Lumbar Exercises: Prone   Opposite Arm/Leg Raise Right arm/Left leg;Left arm/Right leg;10 reps;2 seconds                       PT Long Term Goals - 08/20/19 1610      PT LONG TERM GOAL #1   Title Initiate exercise and back care education with patient to verbalize understanding of importance of exercise on a consistent basis and basic back care principles    Time 6    Period Weeks    Status On-going    Target Date 10/01/19      PT LONG TERM GOAL #2   Title Increase strength Lt LE to 4+/5 to 5/5    Time 6    Period Weeks    Status Achieved    Target Date 08/20/19      PT LONG TERM GOAL #3   Title Patient to report 25% decrease in frequency, intensity or duration of pain    Baseline 15-20% reduction in pain.    Time 6    Period Weeks    Status Partially Met    Target Date 10/01/19      PT LONG TERM GOAL #4   Title Independent in HEP    Time 6    Period Weeks    Status On-going    Target Date 10/01/19        PT LONG TERM GOAL #5   Title Establish aquatic exercise program    Time 6    Period Weeks    Status On-going    Target Date 10/01/19      PT LONG TERM GOAL #6   Title Improve FOTO to </= 40% limitation    Time 6    Period Weeks    Status On-going    Target Date 10/01/19                 Plan - 08/20/19 1644    Clinical Impression Statement Pt reporting reduction of pain by 15-20% and improved activity tolerance.  Pt demonstrated improved hip strength; has met LTG#2.  Pt tolerated all exercises well with occasional twinges in Lt groin.  Pt is making good gains towards remaining goals and will benefit from continued PT intervention to maximize mobility with less pain.    Rehab Potential Good    PT Frequency 2x / week    PT Duration 6 weeks    PT Treatment/Interventions Patient/family education;ADLs/Self Care Home Management;Cryotherapy;Moist Heat;Ultrasound;Functional mobility training;Gait training;Stair training;Therapeutic activities;Therapeutic exercise;Balance training;Neuromuscular re-education;Manual techniques;Dry needling;Taping;Aquatic Therapy    PT Next Visit Plan continue aquatic therapy as schedule allows; continue core stabilization    PT Home Exercise Plan [email or web address]    Consulted and Agree with Plan of Care Patient           Patient will benefit from skilled therapeutic intervention in order to improve the following deficits and impairments:  Pain, Difficulty walking, Decreased range of motion, Increased fascial restricitons  Visit Diagnosis: Radiculopathy, lumbar region - Plan: PT plan of care cert/re-cert  Muscle weakness (generalized) - Plan: PT plan of care cert/re-cert  Other abnormalities of gait and mobility - Plan: PT plan of care cert/re-cert  Cervicalgia - Plan: PT plan of care cert/re-cert     Problem List Patient Active Problem List   Diagnosis Date Noted  .   OSA on CPAP 06/29/2019  . Essential hypertension 12/29/2018   Jennifer  Carlson-Long, PTA 08/20/19 4:53 PM   Celyn P. Holt PT, MPH 08/20/19 4:54 PM    [facility] [address] [place], [place], [address] Phone: [phone]   Fax:  [phone]  Name: Malayah Sapien MRN: [id number] Date of Birth: 10/15/1975   

## 2019-08-22 ENCOUNTER — Encounter: Payer: Self-pay | Admitting: Osteopathic Medicine

## 2019-08-22 ENCOUNTER — Other Ambulatory Visit: Payer: Self-pay

## 2019-08-22 ENCOUNTER — Ambulatory Visit (INDEPENDENT_AMBULATORY_CARE_PROVIDER_SITE_OTHER): Payer: No Typology Code available for payment source | Admitting: Physical Therapy

## 2019-08-22 DIAGNOSIS — R2689 Other abnormalities of gait and mobility: Secondary | ICD-10-CM

## 2019-08-22 DIAGNOSIS — M5416 Radiculopathy, lumbar region: Secondary | ICD-10-CM

## 2019-08-22 DIAGNOSIS — M6281 Muscle weakness (generalized): Secondary | ICD-10-CM

## 2019-08-22 DIAGNOSIS — M542 Cervicalgia: Secondary | ICD-10-CM

## 2019-08-22 NOTE — Therapy (Signed)
[place], [place], [address] Phone: [phone]   Fax:  [phone]  Physical Therapy Treatment  Patient Details  Name: Olivia Hodges MRN: [id number] Date of Birth: [birth year] Referring Provider (PT): Dr Emeterio Reeve    Encounter Date: 08/22/2019   PT End of Session - 08/22/19 1521    Visit Number 12    Number of Visits 24    Date for PT Re-Evaluation 10/01/19    PT Start Time 1540    PT Stop Time 1607   ice pack last 10 min   PT Time Calculation (min) 51 min    Activity Tolerance Patient tolerated treatment well    Behavior During Therapy [facility] for tasks assessed/performed           Past Medical History:  Diagnosis Date  . High blood pressure   . Urticaria    (Pressure)    No past surgical history on file.  There were no vitals filed for this visit.   Subjective Assessment - 08/22/19 1522    Subjective Pt reports she "overdid it" at the [facility]; walking around 2 hrs. "I'm tired." Pain is a little higher today.    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 Yes    Pain Score 6     Pain Location Back    Pain Orientation Left;Right    Pain Descriptors / Indicators Aching    Pain Radiating Towards wraps around to Lt ant hip    Aggravating Factors  standing/ walking    Pain Relieving Factors sitting, meds              [facility] PT Assessment - 08/22/19 0001      Assessment   Medical Diagnosis Cervicalgia; LBP; Lt LE radiculopathy     Referring Provider (PT) Dr Emeterio Reeve     Onset Date/Surgical Date 06/26/18   LBP present x 20 yrs; neck flare up ~ 1 month ago/10 yr hx    Hand Dominance Right    Next MD Visit PRN     Prior Therapy yes; 2017 and 2011            [facility] Adult PT Treatment/Exercise - 08/22/19 0001      Lumbar Exercises: Stretches     Hip Flexor Stretch Left;Right;2 reps;30 seconds    Prone on Elbows Stretch 30 seconds;3 reps    Piriformis Stretch Right;Left;2 reps;20 seconds      Lumbar Exercises: Aerobic   Nustep L5-4: 6 min (legs)      Lumbar Exercises: Standing   Row Strengthening;Right;Left;10 reps    Theraband Level (Row) Level 4 (Blue)    Row Limitations (bow and arrow motion) and bilat x 10    Other Standing Lumbar Exercises side stepping x 20 ft Rt/Lt x 2 reps; anti--rotation with blue band x 10 reps (lateral stepping with band at core)    Other Standing Lumbar Exercises Hip ext with support on counter x 5 reps each leg; opp arm leg lift (hand on cabinet) x 5 reps each side      Modalities   Modalities Cryotherapy      Cryotherapy   Number Minutes Cryotherapy 10 Minutes    Cryotherapy Location Lumbar Spine;Hip   ant Lt hip    Type of Cryotherapy Ice pack  Manual Therapy   Soft tissue mobilization deep tissue mobilization in Lt/Rt  glute & piriformis, bilat lumbar and thoracic paraspinals                       PT Long Term Goals - 08/20/19 1610      PT LONG TERM GOAL #1   Title Initiate exercise and back care education with patient to verbalize understanding of importance of exercise on a consistent basis and basic back care principles    Time 6    Period Weeks    Status On-going    Target Date 10/01/19      PT LONG TERM GOAL #2   Title Increase strength Lt LE to 4+/5 to 5/5    Time 6    Period Weeks    Status Achieved    Target Date 08/20/19      PT LONG TERM GOAL #3   Title Patient to report 25% decrease in frequency, intensity or duration of pain    Baseline 15-20% reduction in pain.    Time 6    Period Weeks    Status Partially Met    Target Date 10/01/19      PT LONG TERM GOAL #4   Title Independent in HEP    Time 6    Period Weeks    Status On-going    Target Date 10/01/19      PT LONG TERM GOAL #5   Title Establish aquatic exercise program    Time 6     Period Weeks    Status On-going    Target Date 10/01/19      PT LONG TERM GOAL #6   Title Improve FOTO to </= 40% limitation    Time 6    Period Weeks    Status On-going    Target Date 10/01/19                 Plan - 08/22/19 1542    Clinical Impression Statement Pt reported no increase in LBP with exercises.  Able to tolerate 20 min of standing exercises, no increased twinges in groin. Progressing well towards remaining goals. Pt will have injections next wk; no therapy appts scheduled for same week. Will return the following week.    Rehab Potential Good    PT Frequency 2x / week    PT Duration 6 weeks    PT Treatment/Interventions Patient/family education;ADLs/Self Care Home Management;Cryotherapy;Moist Heat;Ultrasound;Functional mobility training;Gait training;Stair training;Therapeutic activities;Therapeutic exercise;Balance training;Neuromuscular re-education;Manual techniques;Dry needling;Taping;Aquatic Therapy    PT Next Visit Plan continue aquatic therapy as schedule allows; continue core stabilization    PT Home Exercise Plan [email or web address]    Consulted and Agree with Plan of Care Patient           Patient will benefit from skilled therapeutic intervention in order to improve the following deficits and impairments:  Pain, Difficulty walking, Decreased range of motion, Increased fascial restricitons  Visit Diagnosis: Radiculopathy, lumbar region  Muscle weakness (generalized)  Other abnormalities of gait and mobility  Cervicalgia     Problem List Patient Active Problem List   Diagnosis Date Noted  . OSA on CPAP 06/29/2019  . Essential hypertension 12/29/2018   Jennifer Carlson-Long, PTA 08/22/19 4:19 PM   [facility] [address] [place], [place], [address] Phone: [phone]   Fax:  [phone]  Name: Olivia Hodges MRN: [id number] Date of Birth: 12/01/1975   

## 2019-08-29 DIAGNOSIS — R030 Elevated blood-pressure reading, without diagnosis of hypertension: Secondary | ICD-10-CM | POA: Insufficient documentation

## 2019-09-03 ENCOUNTER — Ambulatory Visit (INDEPENDENT_AMBULATORY_CARE_PROVIDER_SITE_OTHER): Payer: No Typology Code available for payment source | Admitting: Physical Therapy

## 2019-09-03 ENCOUNTER — Encounter: Payer: Self-pay | Admitting: Physical Therapy

## 2019-09-03 ENCOUNTER — Other Ambulatory Visit: Payer: Self-pay

## 2019-09-03 DIAGNOSIS — M5416 Radiculopathy, lumbar region: Secondary | ICD-10-CM

## 2019-09-03 DIAGNOSIS — M6281 Muscle weakness (generalized): Secondary | ICD-10-CM

## 2019-09-03 DIAGNOSIS — R2689 Other abnormalities of gait and mobility: Secondary | ICD-10-CM

## 2019-09-03 NOTE — Therapy (Signed)
[place], [place], [address] Phone: [phone]   Fax:  [phone]  Physical Therapy Treatment  Patient Details  Name: Olivia Hodges MRN: [id number] Date of Birth: 1975-12-26 Referring Provider (PT): Dr Emeterio Reeve    Encounter Date: 09/03/2019   PT End of Session - 09/03/19 1524    Visit Number 13    Number of Visits 24    Date for PT Re-Evaluation 10/01/19    PT Start Time 1518    PT Stop Time 1605   ice pack last 10 min   PT Time Calculation (min) 47 min    Activity Tolerance Patient tolerated treatment well    Behavior During Therapy [facility] for tasks assessed/performed           Past Medical History:  Diagnosis Date  . High blood pressure   . Urticaria    (Pressure)    History reviewed. No pertinent surgical history.  There were no vitals filed for this visit.   Subjective Assessment - 09/03/19 1525    Subjective Pt reports she had her injection last week; no noticeable difference. She feels like maybe this is as good as it's going to get; pleased with progress so far.  She can now lay on her Lt hip at night, whereas she couldn't do that before.    Patient Stated Goals less pain and a little bit more mobility to avoid surgery    Currently in Pain? Yes    Pain Score 3     Pain Location Back    Pain Orientation Right;Left    Pain Descriptors / Indicators Aching    Aggravating Factors  over doing it    Pain Relieving Factors rest, laying down              [facility] PT Assessment - 09/03/19 0001      Assessment   Medical Diagnosis Cervicalgia; LBP; Lt LE radiculopathy     Referring Provider (PT) Dr Emeterio Reeve     Onset Date/Surgical Date 06/26/18   LBP present x 20 yrs; neck flare up ~ 1 month ago/10 yr hx    Hand Dominance Right    Next MD Visit PRN     Prior Therapy yes; 2017 and 2011            [facility] Adult PT Treatment/Exercise - 09/03/19 0001      Lumbar  Exercises: Stretches   Hip Flexor Stretch Left;Right;2 reps;30 seconds    Prone on Elbows Stretch 30 seconds;3 reps    Piriformis Stretch Right;Left;2 reps;30 seconds      Lumbar Exercises: Aerobic   Nustep L4:  min (legs)      Lumbar Exercises: Standing   Row Strengthening;Right;Left;10 reps    Theraband Level (Row) Level 4 (Blue)    Row Limitations (bow and arrow motion)    Other Standing Lumbar Exercises anti--rotation with blue band x 10 reps (lateral stepping with bandin /out from  core)      Lumbar Exercises: Supine   Bridge with [employer] 10 reps;3 seconds   with red band horiz abdct with arms     Lumbar Exercises: Prone   Other Prone Lumbar Exercises axial ext with goal post arms x 10      Lumbar Exercises: Quadruped   Opposite Arm/Leg Raise Right arm/Left leg;Left arm/Right leg;5 reps      Cryotherapy   Number Minutes Cryotherapy 10 Minutes    Cryotherapy Location Lumbar Spine;Hip  ant Lt hip    Type of Cryotherapy Ice pack                       PT Long Term Goals - 09/03/19 1540      PT LONG TERM GOAL #1   Title Initiate exercise and back care education with patient to verbalize understanding of importance of exercise on a consistent basis and basic back care principles    Time 6    Period Weeks    Status On-going      PT LONG TERM GOAL #2   Title Increase strength Lt LE to 4+/5 to 5/5    Time 6    Period Weeks    Status Achieved      PT LONG TERM GOAL #3   Title Patient to report 25% decrease in frequency, intensity or duration of pain    Time 6    Period Weeks    Status Achieved      PT LONG TERM GOAL #4   Title Independent in HEP    Time 6    Period Weeks    Status Partially Met      PT LONG TERM GOAL #5   Title Establish aquatic exercise program    Time 6    Period Weeks    Status Achieved      PT LONG TERM GOAL #6   Title Improve FOTO to </= 40% limitation    Time 6    Period Weeks    Status On-going                  Plan - 09/03/19 1550    Clinical Impression Statement Pt reporting overall improvement in pain level since initiating therapy. Exercise tolerance has improved.  Quadruped position bothered her knees; did not add opp arm/leg exercises in this position to her HEP.  Pt near meeting her goals; Anticipate holding therapy after next visit.    Rehab Potential Good    PT Frequency 2x / week    PT Duration 6 weeks    PT Treatment/Interventions Patient/family education;ADLs/Self Care Home Management;Cryotherapy;Moist Heat;Ultrasound;Functional mobility training;Gait training;Stair training;Therapeutic activities;Therapeutic exercise;Balance training;Neuromuscular re-education;Manual techniques;Dry needling;Taping;Aquatic Therapy    PT Next Visit Plan finalize HEP; FOTO.    PT Home Exercise Plan [email or web address]           Patient will benefit from skilled therapeutic intervention in order to improve the following deficits and impairments:  Pain, Difficulty walking, Decreased range of motion, Increased fascial restricitons  Visit Diagnosis: Radiculopathy, lumbar region  Muscle weakness (generalized)  Other abnormalities of gait and mobility     Problem List Patient Active Problem List   Diagnosis Date Noted  . OSA on CPAP 06/29/2019  . Essential hypertension 12/29/2018   Olivia Hodges, PTA 09/03/19 4:03 PM  [place], [place], [address] Phone: [phone]   Fax:  [phone]  Name: Olivia Hodges MRN: [id number] Date of Birth: [birth year]

## 2019-09-06 ENCOUNTER — Ambulatory Visit (INDEPENDENT_AMBULATORY_CARE_PROVIDER_SITE_OTHER): Payer: No Typology Code available for payment source | Admitting: Physical Therapy

## 2019-09-06 ENCOUNTER — Other Ambulatory Visit: Payer: Self-pay

## 2019-09-06 DIAGNOSIS — M542 Cervicalgia: Secondary | ICD-10-CM

## 2019-09-06 DIAGNOSIS — R2689 Other abnormalities of gait and mobility: Secondary | ICD-10-CM

## 2019-09-06 DIAGNOSIS — M6281 Muscle weakness (generalized): Secondary | ICD-10-CM

## 2019-09-06 DIAGNOSIS — M5416 Radiculopathy, lumbar region: Secondary | ICD-10-CM

## 2019-09-06 NOTE — Patient Instructions (Signed)
Access Code: [email or web address]: [email or web address]: 08/12/2021Prepared by: [place]  Prone on Elbows Stretch - 2 x daily - 7 x weekly - 1 sets - 2 reps - 20 hold  Seated Hip Flexor Stretch - 2 x daily - 7 x weekly - 1 sets - 3 reps - 15-20 hold  Seated Table Piriformis Stretch - 2 x daily - 7 x weekly - 1 sets - 3 reps - 15-20 hold  Standing Pec Stretch at [place] - 2 x daily - 7 x weekly - 1 sets - 2 reps - 15-20 hold  Doorway Pec Stretch at 90 Degrees Abduction - 2 x daily - 7 x weekly - 1 sets - 2-3 reps - 15-20 hold  Seated Shoulder W - 1 x daily - 7 x weekly - 3 sets - 10 reps  Prone Alternating Arm and Leg Lifts - 1 x daily - 7 x weekly - 1 sets - 10 reps  Supine Bridge with Mini Swiss Ball Between Knees - 1 x daily - 7 x weekly - 1 sets - 10 reps - 5 hold  Anti-Rotation Lateral Stepping with Press - 1 x daily - 2 x weekly - 1 sets - 10 reps  Beginner Clam - 1 x daily - 2 x weekly - 1 sets - 10 reps

## 2019-09-06 NOTE — Therapy (Addendum)
[place], [place], [address] Phone: [phone]   Fax:  [phone]  Physical Therapy Treatment  Patient Details  Name: Olivia Hodges MRN: [id number] Date of Birth: [birth year] Referring Provider (PT): Dr Emeterio Reeve    Encounter Date: 09/06/2019   PT End of Session - 09/06/19 1619    Visit Number 14    Number of Visits 24    Date for PT Re-Evaluation 10/01/19    PT Start Time 5465    PT Stop Time 1645    PT Time Calculation (min) 30 min    Activity Tolerance Patient tolerated treatment well    Behavior During Therapy [facility] for tasks assessed/performed           Past Medical History:  Diagnosis Date  . High blood pressure   . Urticaria    (Pressure)    No past surgical history on file.  There were no vitals filed for this visit.   Subjective Assessment - 09/06/19 1723    Subjective Pt reports that the bow and arrow exercise from last session irritated her hips; still sore today.  Overall she is doing well and perfforming a few exercises each day.    Patient Stated Goals less pain and a little bit more mobility to avoid surgery    Currently in Pain? Yes    Pain Score 3     Pain Location Back    Pain Orientation Lower    Pain Descriptors / Indicators Aching    Aggravating Factors  over doing it    Pain Relieving Factors rest,              [facility] PT Assessment - 09/06/19 0001      Assessment   Medical Diagnosis Cervicalgia; LBP; Lt LE radiculopathy     Referring Provider (PT) Dr Emeterio Reeve     Onset Date/Surgical Date 06/26/18   LBP present x 20 yrs; neck flare up ~ 1 month ago/10 yr hx    Hand Dominance Right    Next MD Visit PRN     Prior Therapy yes; 2017 and 2011       Observation/Other Assessments   Focus on Therapeutic Outcomes (FOTO)  43% limitation, goal of 40%            [facility] Adult PT Treatment/Exercise - 09/06/19 0001      Lumbar Exercises:  Stretches   Hip Flexor Stretch --    Prone on Elbows Stretch 1 rep;30 seconds    Press Ups 3 reps    Piriformis Stretch Right;Left;2 reps;30 seconds    Other Lumbar Stretch Exercise middle doorway stretch (unilateral with elbow straight) x 20 sec each arm      Lumbar Exercises: Aerobic   Nustep L5: 5 min (legs only)      Lumbar Exercises: Supine   Bridge with Ball Squeeze 10 reps;5 seconds      Lumbar Exercises: Sidelying   Clam Right;Left;15 reps      Lumbar Exercises: Prone   Opposite Arm/Leg Raise Right arm/Left leg;Left arm/Right leg;10 reps    Other Prone Lumbar Exercises axial ext with goal post arms x 10          verbally and visually reviewed HEP.         PT Education - 09/06/19 1652    Education Details HEP    Person(s) Educated Patient    Methods Explanation;Demonstration;Verbal cues;Handout    Comprehension Returned demonstration;Verbalized understanding  PT Long Term Goals - 09/06/19 1741      PT LONG TERM GOAL #1   Title Initiate exercise and back care education with patient to verbalize understanding of importance of exercise on a consistent basis and basic back care principles    Time 6    Period Weeks    Status Achieved      PT LONG TERM GOAL #2   Title Increase strength Lt LE to 4+/5 to 5/5    Time 6    Period Weeks    Status Achieved      PT LONG TERM GOAL #3   Title Patient to report 25% decrease in frequency, intensity or duration of pain    Time 6    Period Weeks    Status Achieved      PT LONG TERM GOAL #4   Title Independent in HEP    Time 6    Period Weeks    Status Achieved      PT LONG TERM GOAL #5   Title Establish aquatic exercise program    Time 6    Period Weeks    Status Achieved      PT LONG TERM GOAL #6   Title Improve FOTO to </= 40% limitation    Time 6    Period Weeks    Status On-going                 Plan - 09/06/19 1720    Clinical Impression Statement Pt pleased with  progress and level of function. HEP reviewed and reprinted for her.  FOTO score improved, but did not meet goal.  Pt tolerated all exercises well today, without increase in pain.  Pt requests to hold therapy for 30 days while she continues HEP.    Rehab Potential Good    PT Frequency 2x / week    PT Duration 6 weeks    PT Treatment/Interventions Patient/family education;ADLs/Self Care Home Management;Cryotherapy;Moist Heat;Ultrasound;Functional mobility training;Gait training;Stair training;Therapeutic activities;Therapeutic exercise;Balance training;Neuromuscular re-education;Manual techniques;Dry needling;Taping;Aquatic Therapy    PT Next Visit Plan hold until 910/21; if doesn't return, will d/c.    PT Home Exercise Plan [email or web address]           Patient will benefit from skilled therapeutic intervention in order to improve the following deficits and impairments:  Pain, Difficulty walking, Decreased range of motion, Increased fascial restricitons  Visit Diagnosis: Radiculopathy, lumbar region  Muscle weakness (generalized)  Other abnormalities of gait and mobility  Cervicalgia     Problem List Patient Active Problem List   Diagnosis Date Noted  . OSA on CPAP 06/29/2019  . Essential hypertension 12/29/2018    Shelbie Hutching 09/06/2019, 5:41 PM  [facility] [place], [place], [address] Phone: [phone]   Fax:  [phone]  Name: Olivia Hodges MRN: [id number] Date of Birth: 08-Jan-1976  PHYSICAL THERAPY DISCHARGE SUMMARY  Visits from Start of Care: 14  Current functional level related to goals / functional outcomes: See last progress note for discharge status    Remaining deficits: Needs to continue with consistent HEP    Education / Equipment: HEP  Plan: Patient agrees to discharge.  Patient goals were partially met. Patient is being discharged due to not returning since the last visit.   ?????    Celyn P. Helene Kelp PT, MPH 10/03/19 1:51 PM

## 2019-09-10 ENCOUNTER — Encounter: Payer: No Typology Code available for payment source | Admitting: Physical Therapy

## 2019-09-14 ENCOUNTER — Encounter: Payer: No Typology Code available for payment source | Admitting: Physical Therapy

## 2019-09-17 ENCOUNTER — Encounter: Payer: No Typology Code available for payment source | Admitting: Physical Therapy

## 2019-09-21 ENCOUNTER — Encounter: Payer: No Typology Code available for payment source | Admitting: Physical Therapy

## 2019-09-28 ENCOUNTER — Encounter: Payer: No Typology Code available for payment source | Admitting: Physical Therapy

## 2019-10-08 MED FILL — LOSARTAN POTASSIUM 100 MG T: 100 | 90 days supply | Qty: 90 | Fill #1

## 2019-10-08 MED FILL — MONTELUKAST SOD 10 MG TAB: 10 | 90 days supply | Qty: 90 | Fill #1

## 2019-10-29 ENCOUNTER — Other Ambulatory Visit: Payer: Self-pay

## 2019-10-29 ENCOUNTER — Ambulatory Visit (INDEPENDENT_AMBULATORY_CARE_PROVIDER_SITE_OTHER): Payer: No Typology Code available for payment source | Admitting: Osteopathic Medicine

## 2019-10-29 ENCOUNTER — Encounter: Payer: Self-pay | Admitting: Osteopathic Medicine

## 2019-10-29 ENCOUNTER — Other Ambulatory Visit: Payer: Self-pay | Admitting: Osteopathic Medicine

## 2019-10-29 VITALS — BP 169/106 | HR 85 | Wt 293.0 lb

## 2019-10-29 DIAGNOSIS — T753XXD Motion sickness, subsequent encounter: Secondary | ICD-10-CM

## 2019-10-29 DIAGNOSIS — I10 Essential (primary) hypertension: Secondary | ICD-10-CM

## 2019-10-29 DIAGNOSIS — R21 Rash and other nonspecific skin eruption: Secondary | ICD-10-CM

## 2019-10-29 MED ORDER — SCOPOLAMINE 1 MG/3DAYS TD PT72: 1.0000 | MEDICATED_PATCH | TRANSDERMAL | 1 refills | Status: DC

## 2019-10-29 MED ORDER — HYDROCHLOROTHIAZIDE 25 MG PO TABS
25.0000 mg | ORAL_TABLET | Freq: Every day | ORAL | 3 refills | Status: DC
Start: 2019-10-29 — End: 2019-10-29

## 2019-10-29 MED FILL — HYDROCHLOROTHIAZIDE 25 MG T: 25 | 90 days supply | Qty: 90 | Fill #0

## 2019-10-29 MED FILL — SCOPOLAMINE 1 MG/3 DAY PATC: 1 | 30 days supply | Qty: 10 | Fill #0

## 2019-10-29 NOTE — Progress Notes (Signed)
HPI: Olivia Hodges is a 44 y.o. female who  has a past medical history of High blood pressure and Urticaria.  she presents to [facility] today, 10/29/19,  for chief complaint of: Rash, hypertension  Patient reports red discoloration, swelling, warmth, and pain to her right shin from mid-shin down to ankle and spread onto foot. She states symptoms began last week but have now improved significantly, so there is only mild pain and swelling remaining. She frequently gets areas of urticaria in response to unclear triggers, but she states this rash appeared different and did not resolve as quickly as the urticaria typically does. She did not try anything for this, but takes ibuprofen on a daily basis for back pain. She denies fevers, chest pain, SOB, other symptoms.  She is also concerned that her BP has been steadily increasing at home. She has not significantly changed her diet or exercise. She denies any recent stressors. She has been taking all of her medications daily. She is also planning on going on a cruise soon and would like a scopolamine patch.   Past medical, surgical, social and family history reviewed:  Patient Active Problem List   Diagnosis Date Noted  . OSA on CPAP 06/29/2019  . Essential hypertension 12/29/2018    No past surgical history on file.  Social History   Tobacco Use  . Smoking status: Never Smoker  . Smokeless tobacco: Never Used  Substance Use Topics  . Alcohol use: Never    Family History  Problem Relation Age of Onset  . High blood pressure Mother   . Diabetes Mother   . Breast cancer Mother   . Kidney cancer Mother   . Bladder Cancer Mother   . Lymphoma Mother   . High blood pressure Father   . Prostate cancer Father   . Breast cancer Sister   . Lymphoma Sister   . Diabetes Maternal Grandmother   . High blood pressure Paternal Grandmother      Current medication list and allergy/intolerance information  reviewed:    Current Outpatient Medications  Medication Sig Dispense Refill  . hydrochlorothiazide (HYDRODIURIL) 25 MG tablet Take 1 tablet (25 mg total) by mouth daily. 90 tablet 3  . levocetirizine (XYZAL) 5 MG tablet Take 1 tablet (5 mg total) by mouth every evening. 90 tablet 3  . losartan (COZAAR) 100 MG tablet Take 1 tablet (100 mg total) by mouth daily. 90 tablet 1  . meloxicam (MOBIC) 15 MG tablet Take 1 tablet (15 mg total) by mouth daily. 30 tablet 0  . montelukast (SINGULAIR) 10 MG tablet Take 1 tablet (10 mg total) by mouth at bedtime. 90 tablet 3  . tiZANidine (ZANAFLEX) 4 MG tablet Take 1 tablet (4 mg total) by mouth at bedtime as needed for muscle spasms. 90 tablet 1  . scopolamine (TRANSDERM-SCOP, 1.5 MG,) 1 MG/3DAYS Place 1 patch (1.5 mg total) onto the skin every 3 (three) days. 10 patch 1   No current facility-administered medications for this visit.    Allergies  Allergen Reactions  . Lisinopril Hives  . Sulfa Antibiotics Hives      Review of Systems:  Constitutional:  No  fever, no chills  HEENT: No  headache  Cardiac: No  chest pain  Respiratory:  No  shortness of breath. No  Cough  Gastrointestinal: No  abdominal pain, No  nausea, No  vomiting  Musculoskeletal: No new myalgia/arthralgia  Skin: +  Rash, No other wounds/concerning lesions  Neurologic: No  weakness, No  dizziness  Psychiatric: No  concerns with depression  Exam:  BP (!) 169/106 (BP Location: Right Arm, Patient Position: Sitting)   Pulse 85   Wt 293 lb (132.9 kg)   SpO2 100%   BMI 51.90 kg/m   Constitutional: VS see above. General Appearance: alert, well-developed, well-nourished, NAD  Eyes: Normal lids and conjunctive, non-icteric sclera  Neck: No masses, trachea midline.  Respiratory: Normal respiratory effort. no wheeze, no rhonchi, no rales  Cardiovascular: S1/S2 normal, no murmur, no rub/gallop auscultated. RRR. Trace pitting edema in right lower extremity, Scant  edema in LLE. Pedal pulse II/IV bilaterally DP and PT.  Gastrointestinal: Nontender, no masses.  Musculoskeletal: Gait normal. No clubbing/cyanosis of digits.   Neurological: Normal balance/coordination. No tremor. No cranial nerve deficit on limited exam.  Skin: warm, dry, intact. Tiny blood vessels visible on right lower leg, nonblanching. No significant erythema or warmth. Mild tenderness and edema. No concerning nevi or subq nodules on limited exam.  Psychiatric: Normal judgment/insight. Normal mood and affect. Oriented x3.    No results found for this or any previous visit (from the past 72 hour(s)).  No results found.   ASSESSMENT/PLAN: The primary encounter diagnosis was Essential hypertension. Diagnoses of Rash and nonspecific skin eruption and Seasickness, subsequent encounter were also pertinent to this visit.   Rash  Skin eruption seems to have largely resolved without intervention. Likely superficial thrombophlebitis that is resolving. Pt takes daily ibuprofen, which has likely helped.  If symptoms return, follow up with me.  Hypertension  BP 169/106 today. Home BP has been 150s-170s/80s-90s. No red flag symptoms  Increase HCTZ from 12.28m to 278m Continue losartan 10063m Recheck home Bps in 1 week via [employer] La[employer] not resolved, will get BMP. Order placed for lab.  Seasickness  Prescribed scopolamine patch.  Orders Placed This Encounter  Procedures  . BASIC METABOLIC PANEL WITH GFR    Meds ordered this encounter  Medications  . scopolamine (TRANSDERM-SCOP, 1.5 MG,) 1 MG/3DAYS    Sig: Place 1 patch (1.5 mg total) onto the skin every 3 (three) days.    Dispense:  10 patch    Refill:  1  . hydrochlorothiazide (HYDRODIURIL) 25 MG tablet    Sig: Take 1 tablet (25 mg total) by mouth daily.    Dispense:  90 tablet    Refill:  3    There are no Patient Instructions on file for this visit.      Visit summary with medication list and pertinent  instructions was printed for patient to review. All questions at time of visit were answered - patient instructed to contact office with any additional concerns or updates. ER/RTC precautions were reviewed with the patient.   Please note: voice recognition software was used to produce this document, and typos may escape review. Please contact Dr. AleSheppard Coilr any needed clarifications.     Follow-up plan: Return for 06/25/2020 for annual physical, sooner if needed.  AliGweneth DimitriNCMercy Franklin Center3

## 2019-12-14 LAB — BASIC METABOLIC PANEL WITH GFR
BUN: 13 mg/dL (ref 7–25)
CO2: 26 mmol/L (ref 20–32)
Calcium: 9.8 mg/dL (ref 8.6–10.2)
Chloride: 103 mmol/L (ref 98–110)
Creat: 0.72 mg/dL (ref 0.50–1.10)
GFR, Est African American: 116 mL/min/{1.73_m2} (ref >=60)
GFR, Est Non African American: 100 mL/min/{1.73_m2} (ref >=60)
Glucose, Bld: 128 mg/dL (ref 65–139)
Potassium: 4.4 mmol/L (ref 3.5–5.3)
Sodium: 139 mmol/L (ref 135–146)

## 2019-12-31 ENCOUNTER — Other Ambulatory Visit: Payer: Self-pay | Admitting: Osteopathic Medicine

## 2019-12-31 MED FILL — LOSARTAN POTASSIUM 100 MG T: 100 | 90 days supply | Qty: 90 | Fill #0

## 2019-12-31 MED FILL — MELOXICAM 15 MG TABLET: 15 | 90 days supply | Qty: 90 | Fill #0

## 2019-12-31 MED FILL — tiZANidine HCL 4 MG TABS: 4 | 90 days supply | Qty: 90 | Fill #1

## 2019-12-31 MED FILL — MONTELUKAST SOD 10 MG TAB: 10 | 90 days supply | Qty: 90 | Fill #0

## 2020-02-19 MED FILL — HYDROCHLOROTHIAZIDE 25 MG T: 25 | 90 days supply | Qty: 90 | Fill #1

## 2020-03-27 ENCOUNTER — Other Ambulatory Visit: Payer: Self-pay | Admitting: Osteopathic Medicine

## 2020-03-27 MED FILL — LOSARTAN POTASSIUM 100 MG T: 100 | 30 days supply | Qty: 30 | Fill #1

## 2020-03-27 MED FILL — MELOXICAM 15 MG TABLET: 15 | 90 days supply | Qty: 90 | Fill #1

## 2020-03-27 MED FILL — tiZANidine HCL 4 MG TABS: 4 | 90 days supply | Qty: 90 | Fill #0

## 2020-03-27 MED FILL — MONTELUKAST SOD 10 MG TAB: 10 | 90 days supply | Qty: 90 | Fill #1

## 2020-04-17 ENCOUNTER — Other Ambulatory Visit (HOSPITAL_BASED_OUTPATIENT_CLINIC_OR_DEPARTMENT_OTHER): Payer: Self-pay

## 2020-04-18 ENCOUNTER — Other Ambulatory Visit (HOSPITAL_BASED_OUTPATIENT_CLINIC_OR_DEPARTMENT_OTHER): Payer: Self-pay

## 2020-04-29 ENCOUNTER — Other Ambulatory Visit (HOSPITAL_COMMUNITY): Payer: Self-pay

## 2020-04-29 MED FILL — Losartan Potassium Tab 100 MG: ORAL | 90 days supply | Qty: 90 | Fill #0 | Status: AC

## 2020-06-05 ENCOUNTER — Ambulatory Visit (INDEPENDENT_AMBULATORY_CARE_PROVIDER_SITE_OTHER): Payer: No Typology Code available for payment source

## 2020-06-05 ENCOUNTER — Ambulatory Visit (INDEPENDENT_AMBULATORY_CARE_PROVIDER_SITE_OTHER): Payer: No Typology Code available for payment source | Admitting: Osteopathic Medicine

## 2020-06-05 ENCOUNTER — Encounter: Payer: Self-pay | Admitting: Osteopathic Medicine

## 2020-06-05 ENCOUNTER — Other Ambulatory Visit: Payer: Self-pay

## 2020-06-05 ENCOUNTER — Other Ambulatory Visit (HOSPITAL_COMMUNITY): Payer: Self-pay

## 2020-06-05 VITALS — BP 125/80 | HR 76 | Temp 98.7°F | Wt 294.1 lb

## 2020-06-05 DIAGNOSIS — I10 Essential (primary) hypertension: Secondary | ICD-10-CM | POA: Diagnosis not present

## 2020-06-05 DIAGNOSIS — Z0000 Encounter for general adult medical examination without abnormal findings: Secondary | ICD-10-CM

## 2020-06-05 DIAGNOSIS — M25552 Pain in left hip: Secondary | ICD-10-CM | POA: Diagnosis not present

## 2020-06-05 DIAGNOSIS — G8929 Other chronic pain: Secondary | ICD-10-CM

## 2020-06-05 DIAGNOSIS — M5441 Lumbago with sciatica, right side: Secondary | ICD-10-CM | POA: Diagnosis not present

## 2020-06-05 MED ORDER — HYDROCODONE-ACETAMINOPHEN 5-325 MG PO TABS
1.0000 | ORAL_TABLET | Freq: Four times a day (QID) | ORAL | 0 refills | Status: AC | PRN
  Filled 2020-06-05: qty 30, 4d supply, fill #0

## 2020-06-05 MED ORDER — HYDROCODONE-ACETAMINOPHEN 5-325 MG PO TABS: 1.0000 | ORAL_TABLET | Freq: Four times a day (QID) | ORAL | 0 refills | Status: DC | PRN

## 2020-06-05 NOTE — Patient Instructions (Signed)
Plan: Continue Mobic and muscle relaxer Rx sent for Hydrocodone-acetaminophen to use as needed Ortho referral placed - ? Injection vs hip replacement, ? Back surgery  MRI orders placed to look more closely at lumbar spine and at hip

## 2020-06-05 NOTE — Progress Notes (Signed)
Olivia Hodges is a 45 y.o. female who presents to  [place] [facility]  today, 06/05/20, seeking care for the following:  . Back pain: lower back pain ongoing about 2 weeks.  . Hip pain: L hip pain ongoing a few months but worsening, feeling it catching a lot, unable to stand/walk smoothly     ASSESSMENT & PLAN with other pertinent findings:  The primary encounter diagnosis was Left hip pain. Diagnoses of Chronic low back pain with right-sided sciatica, unspecified back pain laterality, Annual physical exam, and Essential hypertension were also pertinent to this visit.   XR lumbar spine and L hip reviewed, looks like L hip arthritis but no dislocation. Lumbar significant calcification of protruding discs L4/5 L5/S1.   Refer to ortho re: opinion on surgical interventions vs joint injections / lumbar fact injections   Patient Instructions  Plan: Continue Mobic and muscle relaxer Rx sent for Hydrocodone-acetaminophen to use as needed Ortho referral placed - ? Injection vs hip replacement, ? Back surgery  MRI orders placed to look more closely at lumbar spine and at hip      Orders Placed This Encounter  Procedures  . DG Hip Unilat W OR W/O Pelvis 2-3 Views Left  . DG Lumbar Spine Complete  . MR Lumbar Spine Wo Contrast  . MR HIP LEFT W WO CONTRAST  . CBC  . COMPLETE METABOLIC PANEL WITH GFR  . Lipid panel  . TSH  . Hemoglobin A1c  . Ambulatory referral to Orthopedic Surgery    Meds ordered this encounter  Medications  . DISCONTD: HYDROcodone-acetaminophen (NORCO/VICODIN) 5-325 MG tablet    Sig: Take 1-2 tablets by mouth every 6 (six) hours as needed for up to 5 days for moderate pain or severe pain.    Dispense:  30 tablet    Refill:  0  . HYDROcodone-acetaminophen (NORCO/VICODIN) 5-325 MG tablet    Sig: Take 1-2 tablets by mouth every 6 (six) hours as needed for up to 5 days for moderate pain or severe pain.     Dispense:  30 tablet    Refill:  0     See below for relevant physical exam findings  See below for recent lab and imaging results reviewed  Medications, allergies, PMH, PSH, SocH, FamH reviewed below    Follow-up instructions: Return for [place] (labs 2+ days prior to appt, orders are in).                                        Exam:  BP 125/80 (BP Location: Left Arm, Patient Position: Sitting, Cuff Size: Large)   Pulse 76   Temp 98.7 F (37.1 C) (Oral)   Wt 294 lb 1.9 oz (133.4 kg)   BMI 52.10 kg/m   Constitutional: VS see above. General Appearance: alert, well-developed, well-nourished, NAD  Neck: No masses, trachea midline.   Respiratory: Normal respiratory effort. no wheeze, no rhonchi, no rales  Cardiovascular: S1/S2 normal, no murmur, no rub/gallop auscultated. RRR.   Musculoskeletal: Gait favors L leg. L hip movement in FABER, FADIR, abduction and SLR causes groin pain. SLR in R and L cause back pain.   Abdominal: non-tender, non-distended, no appreciable organomegaly, neg Murphy's, BS WNLx4  Neurological: Normal balance/coordination. No tremor.  Skin: warm, dry, intact.   Psychiatric: Normal judgment/insight. Normal mood and affect. Oriented  x3.   Current Meds  Medication Sig  . hydrochlorothiazide (HYDRODIURIL) 25 MG tablet TAKE 1 TABLET BY MOUTH DAILY  . levocetirizine (XYZAL) 5 MG tablet Take 1 tablet (5 mg total) by mouth every evening.  Marland Kitchen losartan (COZAAR) 100 MG tablet TAKE 1 TABLET BY MOUTH ONCE DAILY  . meloxicam (MOBIC) 15 MG tablet TAKE 1 TABLET BY MOUTH ONCE DAILY  . montelukast (SINGULAIR) 10 MG tablet TAKE 1 TABLET BY MOUTH EVERY NIGHT AT BEDTIME  . tiZANidine (ZANAFLEX) 4 MG tablet TAKE 1 TABLET BY MOUTH EVERY NIGHT AT BEDTIME AS NEEDED FOR MUSCLE SPASMS  . [DISCONTINUED] HYDROcodone-acetaminophen (NORCO/VICODIN) 5-325 MG tablet Take 1-2 tablets by mouth every 6 (six)  hours as needed for up to 5 days for moderate pain or severe pain.    Allergies  Allergen Reactions  . Lisinopril Hives  . Sulfa Antibiotics Hives  . Other     Patient Active Problem List   Diagnosis Date Noted  . OSA on CPAP 06/29/2019  . Essential hypertension 12/29/2018    Family History  Problem Relation Age of Onset  . High blood pressure Mother   . Diabetes Mother   . Breast cancer Mother   . Kidney cancer Mother   . Bladder Cancer Mother   . Lymphoma Mother   . High blood pressure Father   . Prostate cancer Father   . Breast cancer Sister   . Lymphoma Sister   . Diabetes Maternal Grandmother   . High blood pressure Paternal Grandmother     Social History   Tobacco Use  Smoking Status Never Smoker  Smokeless Tobacco Never Used    No past surgical history on file.  Immunization History  Administered Date(s) Administered  . Influenza-Unspecified 11/07/2018  . PFIZER(Purple Top)SARS-COV-2 Vaccination 02/05/2019, 03/16/2019  . Tdap 05/25/2014, 03/16/2019    No results found for this or any previous visit (from the past 2160 hour(s)).  No results found.     All questions at time of visit were answered - patient instructed to contact office with any additional concerns or updates. ER/RTC precautions were reviewed with the patient as applicable.   Please note: manual typing as well as voice recognition software may have been used to produce this document - typos may escape review. Please contact Dr. Sheppard Coil for any needed clarifications.

## 2020-06-06 LAB — LIPID PANEL
Cholesterol: 200 mg/dL — ABNORMAL HIGH (ref <200)
HDL: 49 mg/dL — ABNORMAL LOW (ref >=50)
LDL Cholesterol (Calc): 123 mg/dL (calc) — ABNORMAL HIGH
Non-HDL Cholesterol (Calc): 151 mg/dL (calc) — ABNORMAL HIGH (ref <130)
Total CHOL/HDL Ratio: 4.1 (calc) (ref <5.0)
Triglycerides: 163 mg/dL — ABNORMAL HIGH (ref <150)

## 2020-06-06 LAB — COMPLETE METABOLIC PANEL WITH GFR
AG Ratio: 1.4 (calc) (ref 1.0–2.5)
ALT: 55 U/L — ABNORMAL HIGH (ref 6–29)
AST: 44 U/L — ABNORMAL HIGH (ref 10–35)
Albumin: 4.5 g/dL (ref 3.6–5.1)
Alkaline phosphatase (APISO): 56 U/L (ref 31–125)
BUN: 13 mg/dL (ref 7–25)
CO2: 25 mmol/L (ref 20–32)
Calcium: 10 mg/dL (ref 8.6–10.2)
Chloride: 100 mmol/L (ref 98–110)
Creat: 0.64 mg/dL (ref 0.50–1.10)
GFR, Est African American: 123 mL/min/{1.73_m2} (ref >=60)
GFR, Est Non African American: 106 mL/min/{1.73_m2} (ref >=60)
Globulin: 3.2 g/dL (calc) (ref 1.9–3.7)
Glucose, Bld: 117 mg/dL — ABNORMAL HIGH (ref 65–99)
Potassium: 4.2 mmol/L (ref 3.5–5.3)
Sodium: 134 mmol/L — ABNORMAL LOW (ref 135–146)
Total Bilirubin: 0.5 mg/dL (ref 0.2–1.2)
Total Protein: 7.7 g/dL (ref 6.1–8.1)

## 2020-06-06 LAB — TSH: TSH: 1.66 mIU/L

## 2020-06-06 LAB — CBC
HCT: 45.9 % — ABNORMAL HIGH (ref 35.0–45.0)
Hemoglobin: 15.4 g/dL (ref 11.7–15.5)
MCH: 30.5 pg (ref 27.0–33.0)
MCHC: 33.6 g/dL (ref 32.0–36.0)
MCV: 90.9 fL (ref 80.0–100.0)
MPV: 10.7 fL (ref 7.5–12.5)
Platelets: 345 10*3/uL (ref 140–400)
RBC: 5.05 10*6/uL (ref 3.80–5.10)
RDW: 12.4 % (ref 11.0–15.0)
WBC: 8.3 10*3/uL (ref 3.8–10.8)

## 2020-06-06 LAB — HEMOGLOBIN A1C
Hgb A1c MFr Bld: 6.9 % of total Hgb — ABNORMAL HIGH (ref <5.7)
Mean Plasma Glucose: 151 mg/dL
eAG (mmol/L): 8.4 mmol/L

## 2020-06-07 ENCOUNTER — Other Ambulatory Visit (HOSPITAL_COMMUNITY): Payer: Self-pay

## 2020-06-09 ENCOUNTER — Ambulatory Visit (INDEPENDENT_AMBULATORY_CARE_PROVIDER_SITE_OTHER): Payer: No Typology Code available for payment source

## 2020-06-09 ENCOUNTER — Other Ambulatory Visit: Payer: Self-pay

## 2020-06-09 DIAGNOSIS — M545 Low back pain, unspecified: Secondary | ICD-10-CM

## 2020-06-09 DIAGNOSIS — G8929 Other chronic pain: Secondary | ICD-10-CM

## 2020-06-09 DIAGNOSIS — M5441 Lumbago with sciatica, right side: Secondary | ICD-10-CM

## 2020-06-09 DIAGNOSIS — M25552 Pain in left hip: Secondary | ICD-10-CM

## 2020-06-09 MED ORDER — GADOBUTROL 1 MMOL/ML IV SOLN: 10.0000 mL | Freq: Once | INTRAVENOUS | Status: DC | PRN

## 2020-06-09 MED ORDER — GADOBUTROL 1 MMOL/ML IV SOLN
10.0000 mL | Freq: Once | INTRAVENOUS | Status: AC | PRN
  Administered 2020-06-09: 10 mL via INTRAVENOUS

## 2020-06-12 ENCOUNTER — Ambulatory Visit (INDEPENDENT_AMBULATORY_CARE_PROVIDER_SITE_OTHER): Payer: No Typology Code available for payment source | Admitting: Physician Assistant

## 2020-06-12 ENCOUNTER — Encounter: Payer: Self-pay | Admitting: Physician Assistant

## 2020-06-12 DIAGNOSIS — M1612 Unilateral primary osteoarthritis, left hip: Secondary | ICD-10-CM

## 2020-06-12 NOTE — Addendum Note (Signed)
Addended by: Robyne Peers on: 06/12/2020 04:44 PM   Modules accepted: Orders

## 2020-06-12 NOTE — Progress Notes (Signed)
Office Visit Note   Patient: Olivia Hodges           Date of Birth: 03-Sep-1975           MRN: [id number] Visit Date: 06/12/2020              Requested by: Emeterio Reeve, [address] [place],  [place] [address] PCP: Emeterio Reeve, DO   Assessment & Plan: Visit Diagnoses:  1. Primary osteoarthritis of left hip     Plan: We will have patient undergo a left hip intra-articular injection with Dr. Ernestina Patches in the near future.  Like to see her back in 2 months after the injection to see what type of response she had.  Also for weight height check.  Questions encouraged and answered at length.  Discussed with her the radiographic findings at length.  Also discussed the fact that she will need to have a BMI under 40 prior to undergoing left total hip arthroplasty.  However like to try conservative treatment before entertaining surgery is an option.    Follow-Up Instructions: Return Follow up 2 months after hip injection.   Orders:  No orders of the defined types were placed in this encounter.  No orders of the defined types were placed in this encounter.     Procedures: No procedures performed   Clinical Data: No additional findings.   Subjective: Chief Complaint  Patient presents with  . Left Hip - Pain    HPI  Olivia Mackie is a 45 year old female comes in today with left hip pain.  States has been going on for the last couple of years getting worse.  She has had no new injury.  She has pain in the left groin area down to the thigh.  She has pain when she tries to stand.  She occasionally has to use a cane to walk.  She is having to walk in the store she has to use a shopping cart to lean on it.  She has had physical therapy with no relief no injections.  She states she is unable to put on her shoes and socks due to the pain in the left hip with any range of motion.  She also has difficulty getting in and out of the car due to the pain.  She has tried meloxicam  which did not help.  She also takes Tylenol which gives her minimal relief. She underwent radiographs of her left hip which are reviewed on epic and by my read show moderate hip arthritis left hip with periarticular spurring.  Cystic changes within the femoral head.  She underwent an MRI of her left hip with contrast dated 06/10/2020.  This is reviewed and shows moderate hip arthritis with joint effusion and signs of synovitis.  Also diffuse degenerative changes involving the left acetabular labrum.  No acute fracture.  No evidence of AVN.  Review of Systems  Constitutional: Negative for fever.  Musculoskeletal: Positive for back pain.     Objective: Vital Signs: There were no vitals taken for this visit.  Physical Exam Constitutional:      Appearance: She is not ill-appearing or diaphoretic.  Pulmonary:     Effort: Pulmonary effort is normal.  Neurological:     Mental Status: She is alert and oriented to person, place, and time.  Psychiatric:        Mood and Affect: Mood normal.     Ortho Exam Left hip any attempts of range of motion causes  severe pain.  Left calf supple nontender.  Dorsiflexion plantarflexion left ankle intact.  Good range of motion of the right hip without pain. Specialty Comments:  No specialty comments available.  Imaging: No results found.   PMFS History: Patient Active Problem List   Diagnosis Date Noted  . OSA on CPAP 06/29/2019  . Essential hypertension 12/29/2018   Past Medical History:  Diagnosis Date  . High blood pressure   . Urticaria    (Pressure)    Family History  Problem Relation Age of Onset  . High blood pressure Mother   . Diabetes Mother   . Breast cancer Mother   . Kidney cancer Mother   . Bladder Cancer Mother   . Lymphoma Mother   . High blood pressure Father   . Prostate cancer Father   . Breast cancer Sister   . Lymphoma Sister   . Diabetes Maternal Grandmother   . High blood pressure Paternal Grandmother      History reviewed. No pertinent surgical history. Social History   Occupational History  . Occupation: Programmer, multimedia: [employer]  Tobacco Use  . Smoking status: Never Smoker  . Smokeless tobacco: Never Used  Vaping Use  . Vaping Use: Never used  Substance and Sexual Activity  . Alcohol use: Never  . Drug use: Never  . Sexual activity: Yes    Partners: Female

## 2020-06-13 ENCOUNTER — Encounter: Payer: Self-pay | Admitting: Osteopathic Medicine

## 2020-06-13 DIAGNOSIS — M25559 Pain in unspecified hip: Secondary | ICD-10-CM

## 2020-06-16 NOTE — Telephone Encounter (Signed)
See referral, I think I got the correct person, thanks !

## 2020-06-25 ENCOUNTER — Ambulatory Visit (INDEPENDENT_AMBULATORY_CARE_PROVIDER_SITE_OTHER): Payer: No Typology Code available for payment source | Admitting: Physical Medicine and Rehabilitation

## 2020-06-25 ENCOUNTER — Other Ambulatory Visit: Payer: Self-pay

## 2020-06-25 ENCOUNTER — Ambulatory Visit: Payer: Self-pay

## 2020-06-25 ENCOUNTER — Encounter: Payer: Self-pay | Admitting: Physical Medicine and Rehabilitation

## 2020-06-25 DIAGNOSIS — M25552 Pain in left hip: Secondary | ICD-10-CM | POA: Diagnosis not present

## 2020-06-25 MED ORDER — TRIAMCINOLONE ACETONIDE 40 MG/ML IJ SUSP
60.0000 mg | INTRAMUSCULAR | Status: AC | PRN
  Administered 2020-06-25: 60 mg via INTRA_ARTICULAR

## 2020-06-25 MED ORDER — BUPIVACAINE HCL 0.25 % IJ SOLN
4.0000 mL | INTRAMUSCULAR | Status: AC | PRN
  Administered 2020-06-25: 4 mL via INTRA_ARTICULAR

## 2020-06-25 NOTE — Progress Notes (Signed)
   Olivia Hodges - 45 y.o. female MRN [id number]  Date of birth: May 03, 1975  Office Visit Note: Visit Date: 06/25/2020 PCP: Emeterio Reeve, DO Referred by: Emeterio Reeve, DO  Subjective: Chief Complaint  Patient presents with  . Left Hip - Pain   HPI:  Olivia Hodges is a 45 y.o. female who comes in today at the request of Benita Stabile, PA-C for planned Left anesthetic hip arthrogram with fluoroscopic guidance.  The patient has failed conservative care including home exercise, medications, time and activity modification.  This injection will be diagnostic and hopefully therapeutic.  Please see requesting physician notes for further details and justification.   ROS Otherwise per HPI.  Assessment & Plan: Visit Diagnoses:    ICD-10-CM   1. Pain in left hip  M25.552 Large Joint Inj: L hip joint    XR C-ARM NO REPORT    Plan: No additional findings.   Meds & Orders: No orders of the defined types were placed in this encounter.   Orders Placed This Encounter  Procedures  . Large Joint Inj: L hip joint  . XR C-ARM NO REPORT    Follow-up: Return if symptoms worsen or fail to improve.   Procedures: Large Joint Inj: L hip joint on 06/25/2020 3:00 PM Indications: diagnostic evaluation and pain Details: 22 G 3.5 in needle, fluoroscopy-guided anterior approach  Arthrogram: No  Medications: 4 mL bupivacaine 0.25 %; 60 mg triamcinolone acetonide 40 MG/ML Outcome: tolerated well, no immediate complications  There was excellent flow of contrast producing a partial arthrogram of the hip. The patient did have relief of symptoms during the anesthetic phase of the injection. Procedure, treatment alternatives, risks and benefits explained, specific risks discussed. Consent was given by the patient. Immediately prior to procedure a time out was called to verify the correct patient, procedure, equipment, support staff and site/side marked as required. Patient was prepped and draped in the usual  sterile fashion.          Clinical History: No specialty comments available.     Objective:  VS:  HT:    WT:   BMI:     BP:   HR: bpm  TEMP: ( )  RESP:  Physical Exam   Imaging: XR C-ARM NO REPORT  Result Date: 06/25/2020 Please see Notes tab for imaging impression.

## 2020-06-25 NOTE — Progress Notes (Signed)
Pt state left hip pain that travels to her groin area and buttock. Pt state walking, standing and laying down makes the pain worse. Pt state she takes pain meds and uses heating pads to help ease her pain.  Numeric Pain Rating Scale and Functional Assessment Average Pain 4   In the last MONTH (on 0-10 scale) has pain interfered with the following?  1. General activity like being  able to carry out your everyday physical activities such as walking, climbing stairs, carrying groceries, or moving a chair?  Rating(9)    -BT, -Dye Allergies.

## 2020-06-30 ENCOUNTER — Other Ambulatory Visit: Payer: Self-pay | Admitting: Osteopathic Medicine

## 2020-06-30 ENCOUNTER — Other Ambulatory Visit (HOSPITAL_COMMUNITY): Payer: Self-pay

## 2020-06-30 MED ORDER — MELOXICAM 15 MG PO TABS
ORAL_TABLET | Freq: Every day | ORAL | 1 refills | Status: DC
  Filled 2020-06-30: qty 90, 90d supply, fill #0

## 2020-06-30 MED FILL — Tizanidine HCl Tab 4 MG (Base Equivalent): ORAL | 90 days supply | Qty: 90 | Fill #0 | Status: AC

## 2020-06-30 MED FILL — Hydrochlorothiazide Tab 25 MG: ORAL | 90 days supply | Qty: 90 | Fill #0 | Status: AC

## 2020-06-30 MED FILL — Montelukast Sodium Tab 10 MG (Base Equiv): ORAL | 90 days supply | Qty: 90 | Fill #0 | Status: AC

## 2020-07-01 ENCOUNTER — Other Ambulatory Visit: Payer: Self-pay

## 2020-07-01 ENCOUNTER — Ambulatory Visit (INDEPENDENT_AMBULATORY_CARE_PROVIDER_SITE_OTHER): Payer: No Typology Code available for payment source | Admitting: Osteopathic Medicine

## 2020-07-01 ENCOUNTER — Encounter: Payer: Self-pay | Admitting: Osteopathic Medicine

## 2020-07-01 VITALS — BP 165/114 | HR 88 | Temp 98.3°F | Wt 293.1 lb

## 2020-07-01 DIAGNOSIS — I10 Essential (primary) hypertension: Secondary | ICD-10-CM

## 2020-07-01 DIAGNOSIS — S73192A Other sprain of left hip, initial encounter: Secondary | ICD-10-CM

## 2020-07-01 DIAGNOSIS — G4733 Obstructive sleep apnea (adult) (pediatric): Secondary | ICD-10-CM | POA: Diagnosis not present

## 2020-07-01 DIAGNOSIS — M48061 Spinal stenosis, lumbar region without neurogenic claudication: Secondary | ICD-10-CM

## 2020-07-01 DIAGNOSIS — Z0000 Encounter for general adult medical examination without abnormal findings: Secondary | ICD-10-CM

## 2020-07-01 DIAGNOSIS — M4316 Spondylolisthesis, lumbar region: Secondary | ICD-10-CM

## 2020-07-01 DIAGNOSIS — M5030 Other cervical disc degeneration, unspecified cervical region: Secondary | ICD-10-CM | POA: Diagnosis not present

## 2020-07-01 DIAGNOSIS — Z9989 Dependence on other enabling machines and devices: Secondary | ICD-10-CM

## 2020-07-01 DIAGNOSIS — M1612 Unilateral primary osteoarthritis, left hip: Secondary | ICD-10-CM | POA: Insufficient documentation

## 2020-07-01 NOTE — Patient Instructions (Signed)
General Preventive Care  Most recent routine screening labs: see attached .   Blood pressure goal 130/80 or less.   Tobacco: don't!  Alcohol: responsible moderation is ok for most adults - if you have concerns about your alcohol intake, please talk to me!   Exercise: as tolerated to reduce risk of cardiovascular disease and diabetes. Strength training will also prevent osteoporosis.   Mental health: if need for mental health care (medicines, counseling, other), or concerns about moods, please let me know!   Sexual / Reproductive health: if need for STD testing, or if concerns with libido/pain problems, please let me know!   Advanced Directive: Living Will and/or Healthcare Power of Attorney recommended for all adults, regardless of age or health.  Vaccines  Flu vaccine: for almost everyone, every fall.   Shingles vaccine: after age 54.   Pneumonia vaccines: after age 71.  Tetanus booster: every 10 years - due 2031  COVID vaccine: THANKS for getting your vaccine! :)  Cancer screenings   Colon cancer screening: for everyone age 53-75. Colonoscopy available for all, many people also qualify for the Cologuard stool test   Breast cancer screening: mammogram at age 48 every other year at least, and annually after age 43.   Cervical cancer screening: Pap every 1 to 5 years depending on age and other risk factors. Can usually stop at age 25 or w/ hysterectomy.   Lung cancer screening: not needed for non-smokers  Infection screenings  . HIV: recommended screening at least once age 79-65. Marland Kitchen Gonorrhea/Chlamydia: screening as needed . Hepatitis C: recommended once for everyone age 71-75 . TB: certain at-risk populations, or depending on work requirements and/or travel history Other . Bone Density Test: recommended for women at age 37

## 2020-07-01 NOTE — Progress Notes (Signed)
Olivia Hodges is a 44 y.o. female who presents to  [place] at [facility]  today, 07/01/20, seeking care for the following:  . Annual physical . Needing referral for bariatric surgery - see letters  . Reviewed labs - A1C first elevated     ASSESSMENT & PLAN with other pertinent findings:  The primary encounter diagnosis was Annual physical exam. Diagnoses of Essential hypertension, OSA on CPAP, Degenerative disc disease, cervical, Primary osteoarthritis of left hip, Tear of left acetabular labrum, initial encounter, Anterolisthesis of lumbar spine, and Spinal stenosis at L4-L5 level were also pertinent to this visit.   1. Annual physical exam See preventive care as below  2. 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 OSA on CPAP Using CPAPA and ebenfiting from thsi Pt will privide sleep study results from previous testing, needs new equipment   4. Degenerative disc disease, cervical 5. Primary osteoarthritis of left hip 6. Tear of left acetabular labrum, initial encounter 7. Anterolisthesis of lumbar spine 8. Spinal stenosis at L4-L5 level Following w/ ortho  9. Morbid obesity Referral in progress to bariatic surgery    Patient Instructions  General Preventive Care  Most recent routine screening labs: see attached .   Blood pressure goal 130/80 or less.   Tobacco: don't!  Alcohol: responsible moderation is ok for most adults - if you have concerns about your alcohol intake, please talk to me!   Exercise: as tolerated to reduce risk of cardiovascular disease and diabetes. Strength training will also prevent osteoporosis.   Mental health: if need for mental health care (medicines, counseling, other), or concerns about moods, please let me know!   Sexual / Reproductive health: if need for STD testing, or if concerns with libido/pain problems, please let me know!    Advanced Directive: Living Will and/or Healthcare Power of Attorney recommended for all adults, regardless of age or health.  Vaccines  Flu vaccine: for almost everyone, every fall.   Shingles vaccine: after age 63.   Pneumonia vaccines: after age 50.  Tetanus booster: every 10 years - due 2031  COVID vaccine: THANKS for getting your vaccine! :)  Cancer screenings   Colon cancer screening: for everyone age 61-75. Colonoscopy available for all, many people also qualify for the Cologuard stool test   Breast cancer screening: mammogram at age 61 every other year at least, and annually after age 76.   Cervical cancer screening: Pap every 1 to 5 years depending on age and other risk factors. Can usually stop at age 27 or w/ hysterectomy.   Lung cancer screening: not needed for non-smokers  Infection screenings  . HIV: recommended screening at least once age 33-65. Marland Kitchen Gonorrhea/Chlamydia: screening as needed . Hepatitis C: recommended once for everyone age 57-75 . TB: certain at-risk populations, or depending on work requirements and/or travel history Other . Bone Density Test: recommended for women at age 73   No orders of the defined types were placed in this encounter.   No orders of the defined types were placed in this encounter.    See below for relevant physical exam findings  See below for recent lab and imaging results reviewed  Medications, allergies, PMH, PSH, SocH, FamH reviewed below    Follow-up instructions: Return in about 3 months (around 10/01/2020) for monitor A1C and blood pressure.  Exam:  BP (!) 165/114 (BP Location: Left Arm, Patient Position: Sitting, Cuff Size: Large)   Pulse 88   Temp 98.3 F (36.8 C) (Oral)   Wt 293 lb 1.9 oz (133 kg)   BMI 51.92 kg/m   Constitutional: VS see above. General Appearance: alert, well-developed, well-nourished, NAD  Neck: No masses,  trachea midline.   Respiratory: Normal respiratory effort. no wheeze, no rhonchi, no rales  Cardiovascular: S1/S2 normal, no murmur, no rub/gallop auscultated. RRR.   Musculoskeletal: Gait normal. Symmetric and independent movement of all extremities  Abdominal: non-tender, non-distended, no appreciable organomegaly, neg Murphy's, BS WNLx4  Neurological: Normal balance/coordination. No tremor.  Skin: warm, dry, intact.   Psychiatric: Normal judgment/insight. Normal mood and affect. Oriented x3.   Current Meds  Medication Sig  . hydrochlorothiazide (HYDRODIURIL) 25 MG tablet TAKE 1 TABLET BY MOUTH DAILY  . levocetirizine (XYZAL) 5 MG tablet Take 1 tablet (5 mg total) by mouth every evening.  Marland Kitchen losartan (COZAAR) 100 MG tablet TAKE 1 TABLET BY MOUTH ONCE DAILY  . meloxicam (MOBIC) 15 MG tablet TAKE 1 TABLET BY MOUTH ONCE DAILY  . montelukast (SINGULAIR) 10 MG tablet TAKE 1 TABLET BY MOUTH EVERY NIGHT AT BEDTIME  . scopolamine (TRANSDERM-SCOP) 1 MG/3DAYS PLACE 1 PATCH ONTO THE SKIN EVERY 3 DAYS  . tiZANidine (ZANAFLEX) 4 MG tablet TAKE 1 TABLET BY MOUTH EVERY NIGHT AT BEDTIME AS NEEDED FOR MUSCLE SPASMS    Allergies  Allergen Reactions  . Lisinopril Hives  . Sulfa Antibiotics Hives  . Other     Patient Active Problem List   Diagnosis Date Noted  . Primary osteoarthritis of left hip 07/01/2020  . Spinal stenosis at L4-L5 level 07/01/2020  . Anterolisthesis of lumbar spine 07/01/2020  . Tear of left acetabular labrum 07/01/2020  . Degenerative disc disease, cervical 07/01/2020  . OSA on CPAP 06/29/2019  . Essential hypertension 12/29/2018    Family History  Problem Relation Age of Onset  . High blood pressure Mother   . Diabetes Mother   . Breast cancer Mother   . Kidney cancer Mother   . Bladder Cancer Mother   . Lymphoma Mother   . High blood pressure Father   . Prostate cancer Father   . Breast cancer Sister   . Lymphoma Sister   . Diabetes Maternal Grandmother    . High blood pressure Paternal Grandmother     Social History   Tobacco Use  Smoking Status Never Smoker  Smokeless Tobacco Never Used    No past surgical history on file.  Immunization History  Administered Date(s) Administered  . Influenza-Unspecified 11/07/2018  . PFIZER(Purple Top)SARS-COV-2 Vaccination 02/05/2019, 03/16/2019  . Tdap 05/25/2014, 03/16/2019    Recent Results (from the past 2160 hour(s))  CBC     Status: Abnormal   Collection Time: 06/05/20 10:56 AM  Result Value Ref Range   WBC 8.3 3.8 - 10.8 Thousand/uL   RBC 5.05 3.80 - 5.10 Million/uL   Hemoglobin 15.4 11.7 - 15.5 g/dL   HCT 45.9 (H) 35.0 - 45.0 %   MCV 90.9 80.0 - 100.0 fL   MCH 30.5 27.0 - 33.0 pg   MCHC 33.6 32.0 - 36.0 g/dL   RDW 12.4 11.0 - 15.0 %   Platelets 345 140 - 400 Thousand/uL   MPV 10.7 7.5 - 12.5 fL  COMPLETE METABOLIC PANEL WITH GFR     Status: Abnormal   Collection Time: 06/05/20 10:56 AM  Result Value Ref Range   Glucose, Bld  117 (H) 65 - 99 mg/dL    Comment: .            Fasting reference interval . For someone without known diabetes, a glucose value between 100 and 125 mg/dL is consistent with prediabetes and should be confirmed with a follow-up test. .    BUN 13 7 - 25 mg/dL   Creat 0.64 0.50 - 1.10 mg/dL   GFR, Est Non African American 106 > OR = 60 mL/min/1.73m   GFR, Est African American 123 > OR = 60 mL/min/1.758m  BUN/Creatinine Ratio NOT APPLICABLE 6 - 22 (calc)   Sodium 134 (L) 135 - 146 mmol/L   Potassium 4.2 3.5 - 5.3 mmol/L   Chloride 100 98 - 110 mmol/L   CO2 25 20 - 32 mmol/L   Calcium 10.0 8.6 - 10.2 mg/dL   Total Protein 7.7 6.1 - 8.1 g/dL   Albumin 4.5 3.6 - 5.1 g/dL   Globulin 3.2 1.9 - 3.7 g/dL (calc)   AG Ratio 1.4 1.0 - 2.5 (calc)   Total Bilirubin 0.5 0.2 - 1.2 mg/dL   Alkaline phosphatase (APISO) 56 31 - 125 U/L   AST 44 (H) 10 - 35 U/L   ALT 55 (H) 6 - 29 U/L  Lipid panel     Status: Abnormal   Collection Time: 06/05/20 10:56 AM   Result Value Ref Range   Cholesterol 200 (H) <200 mg/dL   HDL 49 (L) > OR = 50 mg/dL   Triglycerides 163 (H) <150 mg/dL   LDL Cholesterol (Calc) 123 (H) mg/dL (calc)    Comment: Reference range: <100 . Desirable range <100 mg/dL for primary prevention;   <70 mg/dL for patients with CHD or diabetic patients  with > or = 2 CHD risk factors. . Marland KitchenDL-C is now calculated using the Martin-Hopkins  calculation, which is a validated novel method providing  better accuracy than the Friedewald equation in the  estimation of LDL-C.  MaCresenciano Genret al. JAAnnamaria Helling200630;160(10 [id number]  (http://education.[employer].com/faq/FAQ164)    Total CHOL/HDL Ratio 4.1 <5.0 (calc)   Non-HDL Cholesterol (Calc) 151 (H) <130 mg/dL (calc)    Comment: For patients with diabetes plus 1 major ASCVD risk  factor, treating to a non-HDL-C goal of <100 mg/dL  (LDL-C of <70 mg/dL) is considered a therapeutic  option.   TSH     Status: None   Collection Time: 06/05/20 10:56 AM  Result Value Ref Range   TSH 1.66 mIU/L    Comment:           Reference Range .           > or = 20 Years  0.40-4.50 .                Pregnancy Ranges           First trimester    0.26-2.66           Second trimester   0.55-2.73           Third trimester    0.43-2.91   Hemoglobin A1c     Status: Abnormal   Collection Time: 06/05/20 10:56 AM  Result Value Ref Range   Hgb A1c MFr Bld 6.9 (H) <5.7 % of total Hgb    Comment: For someone without known diabetes, a hemoglobin A1c value of 6.5% or greater indicates that they may have  diabetes and this should be confirmed with a follow-up  test. . For someone with known diabetes,  a value <7% indicates  that their diabetes is well controlled and a value  greater than or equal to 7% indicates suboptimal  control. A1c targets should be individualized based on  duration of diabetes, age, comorbid conditions, and  other considerations. . Currently, no consensus exists regarding use  of hemoglobin A1c for diagnosis of diabetes for children. .    Mean Plasma Glucose 151 mg/dL   eAG (mmol/L) 8.4 mmol/L    No results found.     All questions at time of visit were answered - patient instructed to contact office with any additional concerns or updates. ER/RTC precautions were reviewed with the patient as applicable.   Please note: manual typing as well as voice recognition software may have been used to produce this document - typos may escape review. Please contact Dr. Sheppard Coil for any needed clarifications.

## 2020-07-02 MED ORDER — AMBULATORY NON FORMULARY MEDICATION: 99 refills | Status: DC

## 2020-07-03 ENCOUNTER — Encounter: Payer: Self-pay | Admitting: Osteopathic Medicine

## 2020-07-03 ENCOUNTER — Other Ambulatory Visit: Payer: Self-pay | Admitting: Osteopathic Medicine

## 2020-07-03 DIAGNOSIS — M1612 Unilateral primary osteoarthritis, left hip: Secondary | ICD-10-CM

## 2020-07-03 DIAGNOSIS — G4733 Obstructive sleep apnea (adult) (pediatric): Secondary | ICD-10-CM

## 2020-07-03 DIAGNOSIS — Z9989 Dependence on other enabling machines and devices: Secondary | ICD-10-CM

## 2020-07-03 DIAGNOSIS — I10 Essential (primary) hypertension: Secondary | ICD-10-CM

## 2020-07-16 ENCOUNTER — Other Ambulatory Visit (HOSPITAL_COMMUNITY): Payer: Self-pay | Admitting: Surgery

## 2020-07-16 ENCOUNTER — Other Ambulatory Visit: Payer: Self-pay | Admitting: Surgery

## 2020-07-24 ENCOUNTER — Ambulatory Visit (INDEPENDENT_AMBULATORY_CARE_PROVIDER_SITE_OTHER): Payer: No Typology Code available for payment source

## 2020-07-24 ENCOUNTER — Encounter: Payer: Self-pay | Admitting: Osteopathic Medicine

## 2020-07-24 ENCOUNTER — Other Ambulatory Visit: Payer: Self-pay

## 2020-07-24 DIAGNOSIS — Z01818 Encounter for other preprocedural examination: Secondary | ICD-10-CM

## 2020-07-24 NOTE — Telephone Encounter (Signed)
Surgical clearance appt has been scheduled with Dr A. AM

## 2020-07-25 ENCOUNTER — Ambulatory Visit (INDEPENDENT_AMBULATORY_CARE_PROVIDER_SITE_OTHER): Payer: No Typology Code available for payment source | Admitting: Osteopathic Medicine

## 2020-07-25 ENCOUNTER — Encounter: Payer: Self-pay | Admitting: Osteopathic Medicine

## 2020-07-25 ENCOUNTER — Other Ambulatory Visit: Payer: Self-pay

## 2020-07-25 DIAGNOSIS — I10 Essential (primary) hypertension: Secondary | ICD-10-CM

## 2020-07-25 DIAGNOSIS — M4316 Spondylolisthesis, lumbar region: Secondary | ICD-10-CM

## 2020-07-25 DIAGNOSIS — M5030 Other cervical disc degeneration, unspecified cervical region: Secondary | ICD-10-CM

## 2020-07-25 DIAGNOSIS — G8929 Other chronic pain: Secondary | ICD-10-CM

## 2020-07-25 DIAGNOSIS — G4733 Obstructive sleep apnea (adult) (pediatric): Secondary | ICD-10-CM | POA: Diagnosis not present

## 2020-07-25 DIAGNOSIS — M1612 Unilateral primary osteoarthritis, left hip: Secondary | ICD-10-CM

## 2020-07-25 DIAGNOSIS — M5441 Lumbago with sciatica, right side: Secondary | ICD-10-CM

## 2020-07-25 DIAGNOSIS — Z01818 Encounter for other preprocedural examination: Secondary | ICD-10-CM

## 2020-07-25 DIAGNOSIS — S73192S Other sprain of left hip, sequela: Secondary | ICD-10-CM

## 2020-07-25 DIAGNOSIS — M48061 Spinal stenosis, lumbar region without neurogenic claudication: Secondary | ICD-10-CM

## 2020-07-25 DIAGNOSIS — Z9989 Dependence on other enabling machines and devices: Secondary | ICD-10-CM

## 2020-07-25 NOTE — Progress Notes (Signed)
HPI: Olivia Hodges is a 45 y.o. female who  has a past medical history of High blood pressure and Urticaria.  she presents to [facility] today, 07/25/20,  for chief complaint of:  Surgical clearance, needs EKG   ASSESSMENT/PLAN: The primary encounter diagnosis was Morbid obesity ([place]). Diagnoses of Essential hypertension, OSA on CPAP, Primary osteoarthritis of left hip, Tear of left acetabular labrum, sequela, Anterolisthesis of lumbar spine, Spinal stenosis at L4-L5 level, Chronic low back pain with right-sided sciatica, unspecified back pain laterality, Degenerative disc disease, cervical, and Pre-op exam were also pertinent to this visit.  No cocnerns on EKG see scanned docs     Peri-operative risk stratification.   [name] denies chest pain at rest or with exertion, denies dyspnea at rest or with exertion,denies lower extremity edema, denies claudication or palpitations. Patient has negative history of ischemic heart disease, CHF, CVD, diabetes, alcohol or drug abuse, recent anticoagulation or antithrombotic use, personal or family history of quite neuropathy, or chronic kidney disease.  Patient hasnegative history of undergoing cardiac stress test or catheterization, coronary revascularization.  Patient reports being able to achieve strenuous activity without chest pain   Patient's cardiac risk factors include obesity, OSA, HTN, IFG.  According to the RCR I, this number of risk factors stratify as the patient to class II which carries with it a 6 percent risk of major cardiovascular complications such as MI, CHF, malignant arrhythmia.  In this case however the RCR I likely over -estimates the patient's true cardiac risk given her lack of significant cardiac history, well controlled risk factors   See letter - no contraindication to surgical procedure frmo primary care standpoint    Orders Placed This Encounter  Procedures   EKG 12-Lead      No orders of the defined types were placed in this encounter.   There are no Patient Instructions on file for this visit.    Follow-up plan: Return in about 4 months (around 11/25/2020) for monitr A1C.                                                 ################################################# ################################################# ################################################# #################################################    No outpatient medications have been marked as taking for the 07/25/20 encounter (Office Visit) with Emeterio Reeve, DO.    Allergies  Allergen Reactions   Lisinopril Hives   Sulfa Antibiotics Hives   Other        Review of Systems: Pertinent (+) and (-) ROS in HPI as above   Exam:  BP 130/85   Pulse 78   Ht 5' 3"  (1.6 m)   Wt 293 lb 12.8 oz (133.3 kg)   LMP 07/14/2020   SpO2 100%   BMI 52.04 kg/m  Constitutional: VS see above. General Appearance: alert, well-developed, well-nourished, NAD Neck: No masses, trachea midline.  Respiratory: Normal respiratory effort. no wheeze, no rhonchi, no rales Cardiovascular: S1/S2 normal, no murmur, no rub/gallop auscultated. RRR.  Musculoskeletal: Gait normal. Symmetric and independent movement of all extremities Abdominal: non-tender, non-distended, no appreciable organomegaly, neg Murphy's, BS WNLx4 Neurological: Normal balance/coordination. No tremor. Skin: warm, dry, intact.  Psychiatric: Normal judgment/insight. Normal mood and affect. Oriented x3.       Visit summary with medication list and pertinent instructions was printed for patient to review, patient was advised to alert [place] if any  updates are needed. All questions at time of visit were answered - patient instructed to contact office with any additional concerns. ER/RTC precautions were reviewed with the patient and understanding verbalized.     Please note: voice  recognition software was used to produce this document, and typos may escape review. Please contact Dr. Sheppard Coil for any needed clarifications.    Follow up plan: Return in about 4 months (around 11/25/2020) for monitr A1C.

## 2020-07-29 ENCOUNTER — Other Ambulatory Visit (HOSPITAL_COMMUNITY): Payer: Self-pay

## 2020-07-29 MED FILL — Losartan Potassium Tab 100 MG: ORAL | 90 days supply | Qty: 90 | Fill #1 | Status: AC

## 2020-08-13 ENCOUNTER — Encounter: Payer: Self-pay | Admitting: Physician Assistant

## 2020-08-13 ENCOUNTER — Ambulatory Visit (INDEPENDENT_AMBULATORY_CARE_PROVIDER_SITE_OTHER): Payer: No Typology Code available for payment source | Admitting: Physician Assistant

## 2020-08-13 VITALS — Ht 63.0 in | Wt 294.0 lb

## 2020-08-13 DIAGNOSIS — M1612 Unilateral primary osteoarthritis, left hip: Secondary | ICD-10-CM

## 2020-08-13 NOTE — Progress Notes (Signed)
HPI: Olivia Hodges returns today follow-up of left hip injection 06/25/2021.  She states she is about 50% better after the injection.  She has no concerns.  To schedule follow and takes Mobic which helps some.  She occasionally uses cane if she is going to be walking long distances or on uneven ground.  She has been talking to a general surgeon about bariatric surgery.  Height 5 foot 3 inches Weight 294 pounds BMI 52.09 Physical exam: General well-developed well-nourished female no acute distress ambulates without any assistive device. Left hip good range of motion some discomfort with external rotation.  This is improved from prior exam where it any attempts of range of motion causes severe pain.  Impression: Left hip arthritis  Plan: She will follow-up with [place] as needed.  Would not recommend repeat injection for 6 months.  Questions encouraged and answered at length.

## 2020-08-27 ENCOUNTER — Encounter: Payer: Self-pay | Admitting: Skilled Nursing Facility1

## 2020-08-27 ENCOUNTER — Encounter: Payer: No Typology Code available for payment source | Attending: Surgery | Admitting: Skilled Nursing Facility1

## 2020-08-27 ENCOUNTER — Ambulatory Visit (INDEPENDENT_AMBULATORY_CARE_PROVIDER_SITE_OTHER): Payer: No Typology Code available for payment source | Admitting: Psychology

## 2020-08-27 ENCOUNTER — Other Ambulatory Visit: Payer: Self-pay

## 2020-08-27 DIAGNOSIS — E669 Obesity, unspecified: Secondary | ICD-10-CM | POA: Insufficient documentation

## 2020-08-27 DIAGNOSIS — F509 Eating disorder, unspecified: Secondary | ICD-10-CM | POA: Diagnosis not present

## 2020-08-27 NOTE — Progress Notes (Signed)
Nutrition Assessment for Bariatric Surgery Medical Nutrition Therapy Appt Start Time: 10:49    End Time: 11:45  Patient was seen on 08/27/2020 for Pre-Operative Nutrition Assessment. Letter of approval faxed to [facility] Surgery bariatric surgery program coordinator on 08/27/2020.   Referral stated Supervised Weight Loss (SWL) visits needed: 0  Pt completed visits.   Pt has cleared nutrition requirements.    Planned surgery: RYGB Pt expectation of surgery: to be healthy Pt expectation of dietitian: to help guide    NUTRITION ASSESSMENT   Anthropometrics  Start weight at [facility]: 294.3 lbs (date: 08/27/2020)  Height: 63 in BMI: 52.17 kg/m2     Clinical  Medical hx: HTN, sleep apnea Medications: see list  Labs: A1C 6.9, Cholesterol 200, HDL 49, Tryglycerides 163, LDL 123, AST 44, ALT 55 Notable signs/symptoms: hip and back pain Any previous deficiencies? No  Micronutrient Nutrition Focused Physical Exam: Hair: No issues observed Eyes: No issues observed Mouth: No issues observed Neck: No issues observed Nails: No issues observed Skin: No issues observed  Lifestyle & Dietary Hx  Pt states non starchy vegetables are not her favorite.   Pt state she has been cutting back on soda has been able to cut back to one can per day.  Pt states she has been working on eating until satisfaction not ever eating or cleaning her plate and has been doing this by slowing down.  Pt state she has been trying protein shakes.   24-Hr Dietary Recall First Meal:  cereal or sandwich or skipped Snack:  Second Meal: skipped Snack:  Third Meal: pork chop + rice or potato + carrot Snack:  Beverages: coffee + sweet creamer + 2% milk, soda, water   Estimated Energy Needs Calories: 1500   NUTRITION DIAGNOSIS  Overweight/obesity ([place]-3.3) related to past poor dietary habits and physical inactivity as evidenced by patient w/ planned RYGB surgery following dietary guidelines for continued  weight loss.    NUTRITION INTERVENTION  Nutrition counseling (C-1) and education (E-2) to facilitate bariatric surgery goals.  Educated pt on micronutrient deficiencies post surgery and strategies to mitigate that risk Educated pt on A1C and blood sugar control    Pre-Op Goals Reviewed with the Patient Track food and beverage intake (pen and paper, MyFitness Pal, Baritastic app, etc.) Make healthy food choices while monitoring portion sizes Consume 3 meals per day or try to eat every 3-5 hours Avoid concentrated sugars and fried foods Keep sugar & fat in the single digits per serving on food labels Practice CHEWING your food (aim for applesauce consistency) Practice not drinking 15 minutes before, during, and 30 minutes after each meal and snack Avoid all carbonated beverages (ex: soda, sparkling beverages)  Limit caffeinated beverages (ex: coffee, tea, energy drinks) Avoid all sugar-sweetened beverages (ex: regular soda, sports drinks)  Avoid alcohol  Aim for 64-100 ounces of FLUID daily (with at least half of fluid intake being plain water)  Aim for at least 60-80 grams of PROTEIN daily Look for a liquid protein source that contains ?15 g protein and ?5 g carbohydrate (ex: shakes, drinks, shots) Make a list of non-food related activities Physical activity is an important part of a healthy lifestyle so keep it moving! The goal is to reach 150 minutes of exercise per week, including cardiovascular and weight baring activity.  *Goals that are bolded indicate the pt would like to start working towards these  Handouts Provided Include  Bariatric Surgery handouts (Nutrition Visits, Pre-Op Goals, Protein Shakes, Vitamins & Minerals)  Learning Style & Readiness for Change Teaching method utilized: Visual & Auditory  Demonstrated degree of understanding via: Teach Back  Readiness Level: Action Barriers to learning/adherence to lifestyle change: none identified      MONITORING &  EVALUATION Dietary intake, weekly physical activity, body weight, and pre-op goals reached at next nutrition visit.    Next Steps  Patient is to follow up at [facility] for Pre-Op Class >2 weeks before surgery for further nutrition education.   Pt has completed visits. No further supervised visits required/recomended

## 2020-09-08 ENCOUNTER — Ambulatory Visit (INDEPENDENT_AMBULATORY_CARE_PROVIDER_SITE_OTHER): Payer: No Typology Code available for payment source | Admitting: Psychology

## 2020-09-08 DIAGNOSIS — F509 Eating disorder, unspecified: Secondary | ICD-10-CM

## 2020-09-09 ENCOUNTER — Ambulatory Visit: Payer: Self-pay | Admitting: Surgery

## 2020-09-23 ENCOUNTER — Other Ambulatory Visit (HOSPITAL_COMMUNITY): Payer: Self-pay

## 2020-09-23 ENCOUNTER — Encounter: Payer: Self-pay | Admitting: Osteopathic Medicine

## 2020-09-23 ENCOUNTER — Ambulatory Visit (INDEPENDENT_AMBULATORY_CARE_PROVIDER_SITE_OTHER): Payer: No Typology Code available for payment source | Admitting: Osteopathic Medicine

## 2020-09-23 ENCOUNTER — Other Ambulatory Visit: Payer: Self-pay

## 2020-09-23 VITALS — BP 160/82 | HR 93

## 2020-09-23 DIAGNOSIS — Z1211 Encounter for screening for malignant neoplasm of colon: Secondary | ICD-10-CM | POA: Diagnosis not present

## 2020-09-23 DIAGNOSIS — Z0000 Encounter for general adult medical examination without abnormal findings: Secondary | ICD-10-CM

## 2020-09-23 DIAGNOSIS — M5030 Other cervical disc degeneration, unspecified cervical region: Secondary | ICD-10-CM

## 2020-09-23 DIAGNOSIS — J309 Allergic rhinitis, unspecified: Secondary | ICD-10-CM

## 2020-09-23 DIAGNOSIS — Z1231 Encounter for screening mammogram for malignant neoplasm of breast: Secondary | ICD-10-CM | POA: Diagnosis not present

## 2020-09-23 DIAGNOSIS — I10 Essential (primary) hypertension: Secondary | ICD-10-CM

## 2020-09-23 DIAGNOSIS — M5441 Lumbago with sciatica, right side: Secondary | ICD-10-CM

## 2020-09-23 DIAGNOSIS — G4733 Obstructive sleep apnea (adult) (pediatric): Secondary | ICD-10-CM

## 2020-09-23 DIAGNOSIS — M48061 Spinal stenosis, lumbar region without neurogenic claudication: Secondary | ICD-10-CM

## 2020-09-23 DIAGNOSIS — G8929 Other chronic pain: Secondary | ICD-10-CM

## 2020-09-23 DIAGNOSIS — Z9989 Dependence on other enabling machines and devices: Secondary | ICD-10-CM

## 2020-09-23 DIAGNOSIS — M4316 Spondylolisthesis, lumbar region: Secondary | ICD-10-CM

## 2020-09-23 MED ORDER — MELOXICAM 15 MG PO TABS
ORAL_TABLET | Freq: Every day | ORAL | 3 refills | Status: DC
  Filled 2020-09-23: qty 90, 90d supply, fill #0

## 2020-09-23 MED ORDER — LOSARTAN POTASSIUM 100 MG PO TABS
100.0000 mg | ORAL_TABLET | Freq: Every day | ORAL | 3 refills | Status: DC
  Filled 2020-09-23 – 2020-10-28 (×2): qty 90, 90d supply, fill #0
  Filled 2021-01-27: qty 90, 90d supply, fill #1
  Filled 2021-04-29: qty 90, 90d supply, fill #2
  Filled 2021-07-24: qty 90, 90d supply, fill #3

## 2020-09-23 MED ORDER — AMBULATORY NON FORMULARY MEDICATION: 99 refills | Status: DC

## 2020-09-23 MED ORDER — LEVOCETIRIZINE DIHYDROCHLORIDE 5 MG PO TABS
5.0000 mg | ORAL_TABLET | Freq: Every evening | ORAL | 3 refills | Status: DC
  Filled 2020-09-23: qty 90, 90d supply, fill #0
  Filled 2021-01-01: qty 90, 90d supply, fill #1
  Filled 2021-03-30: qty 90, 90d supply, fill #2
  Filled 2021-06-29: qty 90, 90d supply, fill #3

## 2020-09-23 MED ORDER — HYDROCHLOROTHIAZIDE 25 MG PO TABS
ORAL_TABLET | Freq: Every day | ORAL | 3 refills | Status: DC
  Filled 2020-09-23: qty 90, 90d supply, fill #0
  Filled 2021-01-01: qty 90, 90d supply, fill #1
  Filled 2021-04-29: qty 90, 90d supply, fill #2
  Filled 2021-07-24: qty 90, 90d supply, fill #3

## 2020-09-23 MED ORDER — MONTELUKAST SODIUM 10 MG PO TABS
ORAL_TABLET | Freq: Every day | ORAL | 3 refills | Status: DC
Start: 2020-09-23 — End: 2021-10-05
  Filled 2020-09-23: qty 90, 90d supply, fill #0
  Filled 2021-01-01: qty 90, 90d supply, fill #1
  Filled 2021-03-30: qty 90, 90d supply, fill #2
  Filled 2021-06-29: qty 90, 90d supply, fill #3

## 2020-09-23 MED ORDER — TIZANIDINE HCL 4 MG PO TABS
ORAL_TABLET | ORAL | 3 refills | Status: DC
  Filled 2020-09-23: qty 90, 90d supply, fill #0
  Filled 2021-01-01: qty 90, 90d supply, fill #1
  Filled 2021-03-30: qty 90, 90d supply, fill #2
  Filled 2021-06-29: qty 90, 90d supply, fill #3

## 2020-09-23 NOTE — Progress Notes (Signed)
Olivia Hodges is a 45 y.o. female who presents to  [place] [facility]  today, 09/23/20, seeking care for the following:  Check up / preventive care - pt aware I will be leaving practice so just wanted to check up on a few things to make sure everything is in order      [place] with other pertinent findings:  The primary encounter diagnosis was Encounter for preventive health examination. Diagnoses of Breast cancer screening by mammogram, Colon cancer screening, Morbid obesity ([place]), Essential hypertension, OSA on CPAP, Chronic low back pain with right-sided sciatica, unspecified back pain laterality, Spinal stenosis at L4-L5 level, Anterolisthesis of lumbar spine, Degenerative disc disease, cervical, and Allergic rhinitis, unspecified seasonality, unspecified trigger were also pertinent to this visit.   1. Encounter for preventive health examination UTD for the most part, see pt instructions  2. Breast cancer screening by mammogram [place] ordered  3. Colon cancer screening ordered  4. Morbid obesity ([place]) Awaiting bariatrics at this point - comorbids as below  5. 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 OSA on CPAP Using and benefitting from this   7. Chronic low back pain with right-sided sciatica, unspecified back pain laterality 8. Spinal stenosis at L4-L5 level 9. Anterolisthesis of lumbar spine 10. Degenerative disc disease, cervical Stable, meds refilled   11. Allergic rhinitis, unspecified seasonality, unspecified trigger Meds refilled    Patient Instructions  Mammogram - they should call you, if you don't hear anything in a few business days call [phone].   Pap recommended.   Colon cancer screening -  Cologuard ordered.     Orders Placed This Encounter  Procedures   MM 3D SCREEN BREAST BILATERAL   Cologuard    Meds ordered this encounter  Medications   AMBULATORY NON FORMULARY MEDICATION    Sig: Supply ordered: CPAP and other supplies needed (headgear, cushions, filters, heated tuubing and water chamber). Patient needs tubing and face mask!  Dx: obstructive sleep apnea Settings: auto-titration 5-20 cmH2O    Dispense:  1 Units    Refill:  99   hydrochlorothiazide (HYDRODIURIL) 25 MG tablet    Sig: TAKE 1 TABLET BY MOUTH DAILY    Dispense:  90 tablet    Refill:  3   losartan (COZAAR) 100 MG tablet    Sig: TAKE 1 TABLET BY MOUTH ONCE DAILY    Dispense:  90 tablet    Refill:  3   montelukast (SINGULAIR) 10 MG tablet    Sig: TAKE 1 TABLET BY MOUTH EVERY NIGHT AT BEDTIME    Dispense:  90 tablet    Refill:  3   meloxicam (MOBIC) 15 MG tablet    Sig: TAKE 1 TABLET BY MOUTH ONCE DAILY    Dispense:  90 tablet    Refill:  3   tiZANidine (ZANAFLEX) 4 MG tablet    Sig: TAKE 1 TABLET BY MOUTH EVERY NIGHT AT BEDTIME AS NEEDED FOR MUSCLE SPASMS    Dispense:  90 tablet    Refill:  3   levocetirizine (XYZAL) 5 MG tablet    Sig: Take 1 tablet by mouth every evening.    Dispense:  90 tablet    Refill:  3  See below for relevant physical exam findings  See below for recent lab and imaging results reviewed  Medications, allergies, PMH, PSH, SocH, [place] reviewed below    Follow-up instructions: Return in about 4 months (around 01/23/2021) for [place], NP .                                        Exam:  BP (!) 160/82   Pulse 93   SpO2 100%  Constitutional: VS see above. General Appearance: alert, well-developed, well-nourished, NAD Neck: No masses, trachea midline.  Respiratory: Normal respiratory effort. no wheeze, no rhonchi, no rales Cardiovascular: S1/S2 normal, no murmur, no rub/gallop auscultated. RRR.   Musculoskeletal: Gait normal. Symmetric and independent movement of all extremities Neurological: Normal balance/coordination. No tremor. Skin: warm, dry, intact.  Psychiatric: Normal judgment/insight. Normal mood and affect. Oriented x3.   Current Meds  Medication Sig   [DISCONTINUED] AMBULATORY NON FORMULARY MEDICATION Supply ordered: CPAP and other supplies needed (headgear, cushions, filters, heated tuubing and water chamber) Dx: obstructive sleep apnea Settings: auto-titration 5-20 cmH2O   [DISCONTINUED] hydrochlorothiazide (HYDRODIURIL) 25 MG tablet TAKE 1 TABLET BY MOUTH DAILY   [DISCONTINUED] levocetirizine (XYZAL) 5 MG tablet Take 1 tablet (5 mg total) by mouth every evening.   [DISCONTINUED] losartan (COZAAR) 100 MG tablet TAKE 1 TABLET BY MOUTH ONCE DAILY   [DISCONTINUED] meloxicam (MOBIC) 15 MG tablet TAKE 1 TABLET BY MOUTH ONCE DAILY   [DISCONTINUED] montelukast (SINGULAIR) 10 MG tablet TAKE 1 TABLET BY MOUTH EVERY NIGHT AT BEDTIME   [DISCONTINUED] tiZANidine (ZANAFLEX) 4 MG tablet TAKE 1 TABLET BY MOUTH EVERY NIGHT AT BEDTIME AS NEEDED FOR MUSCLE SPASMS    Allergies  Allergen Reactions   Lisinopril Hives   Sulfa Antibiotics Hives   Other     Patient Active Problem List   Diagnosis Date Noted   Primary osteoarthritis of left hip 07/01/2020   Spinal stenosis at L4-L5 level 07/01/2020   Anterolisthesis of lumbar spine 07/01/2020   Tear of left acetabular labrum 07/01/2020   Degenerative disc disease, cervical 07/01/2020   OSA on CPAP 06/29/2019   Essential hypertension 12/29/2018    Family History  Problem Relation Age of Onset   High blood pressure Mother    Diabetes Mother    Breast cancer Mother    Kidney cancer Mother    Bladder Cancer Mother    Lymphoma Mother    High blood pressure Father    Prostate cancer Father    Breast cancer Sister    Lymphoma Sister    Diabetes Maternal Grandmother    High blood pressure Paternal Grandmother     Social  History   Tobacco Use  Smoking Status Never  Smokeless Tobacco Never    History reviewed. No pertinent surgical history.  Immunization History  Administered Date(s) Administered   Influenza-Unspecified 11/07/2018   PFIZER(Purple Top)SARS-COV-2 Vaccination 02/05/2019, 03/16/2019   Tdap 05/25/2014, 03/16/2019    No results found for this or any previous visit (from the past 2160 hour(s)).  No results found.     All questions at time of visit were answered - patient instructed to contact office with any additional concerns or updates. ER/RTC precautions were reviewed with the patient as applicable.   Please note: manual typing as well as voice recognition software may have been used to produce this document - typos may escape review. Please contact  Dr. Sheppard Coil for any needed clarifications.

## 2020-09-23 NOTE — Patient Instructions (Addendum)
Mammogram - they should call you, if you don't hear anything in a few business days call [phone].   Pap recommended.   Colon cancer screening - Cologuard ordered.

## 2020-10-01 ENCOUNTER — Ambulatory Visit: Payer: No Typology Code available for payment source | Admitting: Osteopathic Medicine

## 2020-10-08 ENCOUNTER — Other Ambulatory Visit: Payer: Self-pay

## 2020-10-08 ENCOUNTER — Ambulatory Visit (INDEPENDENT_AMBULATORY_CARE_PROVIDER_SITE_OTHER): Payer: No Typology Code available for payment source

## 2020-10-08 DIAGNOSIS — Z1231 Encounter for screening mammogram for malignant neoplasm of breast: Secondary | ICD-10-CM | POA: Diagnosis not present

## 2020-10-09 ENCOUNTER — Other Ambulatory Visit: Payer: Self-pay

## 2020-10-09 ENCOUNTER — Encounter: Payer: Self-pay | Admitting: Osteopathic Medicine

## 2020-10-09 MED ORDER — AMBULATORY NON FORMULARY MEDICATION: 99 refills | Status: AC

## 2020-10-28 ENCOUNTER — Other Ambulatory Visit (HOSPITAL_COMMUNITY): Payer: Self-pay

## 2020-10-29 ENCOUNTER — Encounter (HOSPITAL_COMMUNITY): Payer: Self-pay | Admitting: Surgery

## 2020-11-07 ENCOUNTER — Ambulatory Visit (HOSPITAL_COMMUNITY): Payer: No Typology Code available for payment source | Admitting: Anesthesiology

## 2020-11-07 ENCOUNTER — Ambulatory Visit (HOSPITAL_COMMUNITY)
Admission: RE | Admit: 2020-11-07 | Discharge: 2020-11-07 | Disposition: A | Discharge status: Home / Self Care | Payer: No Typology Code available for payment source | Attending: Surgery | Admitting: Surgery

## 2020-11-07 ENCOUNTER — Encounter (HOSPITAL_COMMUNITY)
Admission: RE | Disposition: A | Discharge status: Home / Self Care | Payer: Self-pay | Source: Home / Self Care | Attending: Surgery

## 2020-11-07 ENCOUNTER — Other Ambulatory Visit: Payer: Self-pay

## 2020-11-07 DIAGNOSIS — Z79899 Other long term (current) drug therapy: Secondary | ICD-10-CM | POA: Diagnosis not present

## 2020-11-07 DIAGNOSIS — Z888 Allergy status to other drugs, medicaments and biological substances status: Secondary | ICD-10-CM | POA: Insufficient documentation

## 2020-11-07 DIAGNOSIS — I10 Essential (primary) hypertension: Secondary | ICD-10-CM | POA: Diagnosis not present

## 2020-11-07 DIAGNOSIS — Z6841 Body Mass Index (BMI) 40.0 and over, adult: Secondary | ICD-10-CM | POA: Insufficient documentation

## 2020-11-07 DIAGNOSIS — Z791 Long term (current) use of non-steroidal anti-inflammatories (NSAID): Secondary | ICD-10-CM | POA: Insufficient documentation

## 2020-11-07 DIAGNOSIS — Z01818 Encounter for other preprocedural examination: Secondary | ICD-10-CM | POA: Diagnosis not present

## 2020-11-07 DIAGNOSIS — Z882 Allergy status to sulfonamides status: Secondary | ICD-10-CM | POA: Diagnosis not present

## 2020-11-07 DIAGNOSIS — G4733 Obstructive sleep apnea (adult) (pediatric): Secondary | ICD-10-CM | POA: Diagnosis not present

## 2020-11-07 DIAGNOSIS — E7800 Pure hypercholesterolemia, unspecified: Secondary | ICD-10-CM | POA: Insufficient documentation

## 2020-11-07 DIAGNOSIS — K3189 Other diseases of stomach and duodenum: Secondary | ICD-10-CM | POA: Diagnosis not present

## 2020-11-07 DIAGNOSIS — E119 Type 2 diabetes mellitus without complications: Secondary | ICD-10-CM | POA: Insufficient documentation

## 2020-11-07 HISTORY — PX: BIOPSY: SHX5522

## 2020-11-07 HISTORY — PX: ESOPHAGOGASTRODUODENOSCOPY: SHX5428

## 2020-11-07 SURGERY — EGD (ESOPHAGOGASTRODUODENOSCOPY)
Anesthesia: Monitor Anesthesia Care

## 2020-11-07 MED ORDER — LACTATED RINGERS IV SOLN
INTRAVENOUS | Status: DC
  Administered 2020-11-07: 1000 mL via INTRAVENOUS

## 2020-11-07 MED ORDER — PROPOFOL 500 MG/50ML IV EMUL
INTRAVENOUS | Status: AC
  Filled 2020-11-07: qty 50

## 2020-11-07 MED ORDER — PROPOFOL 10 MG/ML IV BOLUS
INTRAVENOUS | Status: DC | PRN
  Administered 2020-11-07: 30 mg via INTRAVENOUS
  Administered 2020-11-07: 40 mg via INTRAVENOUS

## 2020-11-07 MED ORDER — LACTATED RINGERS IV SOLN: INTRAVENOUS | Status: DC

## 2020-11-07 MED ORDER — PROPOFOL 500 MG/50ML IV EMUL
INTRAVENOUS | Status: DC | PRN
  Administered 2020-11-07: 125 ug/kg/min via INTRAVENOUS

## 2020-11-07 NOTE — H&P (Signed)
Admitting Physician: Emanuel  Service: Bariatric surgery  CC: Morbid obesity  Subjective   HPI: Olivia Hodges is an 45 y.o. female who is here for preoperative EGD prior to bariatric surgery.  Past Medical History:  Diagnosis Date   High blood pressure    Sleep apnea    Urticaria    (Pressure)    History reviewed. No pertinent surgical history.  Family History  Problem Relation Age of Onset   High blood pressure Mother    Diabetes Mother    Breast cancer Mother 24   Kidney cancer Mother    Bladder Cancer Mother    Lymphoma Mother    High blood pressure Father    Prostate cancer Father    Breast cancer Sister 16       39   Lymphoma Sister    Diabetes Maternal Grandmother    High blood pressure Paternal Grandmother     Social:  reports that she has never smoked. She has never used smokeless tobacco. She reports that she does not drink alcohol and does not use drugs.  Allergies:  Allergies  Allergen Reactions   Lisinopril Hives   Sulfa Antibiotics Hives    Medications: Current Outpatient Medications  Medication Instructions   acetaminophen (TYLENOL) 1,000 mg, Oral, Every 6 hours PRN   AMBULATORY NON FORMULARY MEDICATION Supply ordered: CPAP and other supplies needed (headgear, cushions, filters, heated tuubing and water chamber). Patient needs tubing and face mask!  Dx: obstructive sleep apnea Settings: auto-titration 5-20 cmH2O   hydrochlorothiazide (HYDRODIURIL) 25 MG tablet TAKE 1 TABLET BY MOUTH DAILY   levocetirizine (XYZAL) 5 MG tablet Take 1 tablet by mouth every evening.   losartan (COZAAR) 100 MG tablet TAKE 1 TABLET BY MOUTH ONCE DAILY   meloxicam (MOBIC) 15 MG tablet TAKE 1 TABLET BY MOUTH ONCE DAILY   montelukast (SINGULAIR) 10 MG tablet TAKE 1 TABLET BY MOUTH EVERY NIGHT AT BEDTIME   tiZANidine (ZANAFLEX) 4 MG tablet TAKE 1 TABLET BY MOUTH EVERY NIGHT AT BEDTIME AS NEEDED FOR MUSCLE SPASMS    ROS - all of the below systems have  been reviewed with the patient and positives are indicated with bold text General: chills, fever or night sweats Eyes: blurry vision or double vision ENT: epistaxis or sore throat Allergy/Immunology: itchy/watery eyes or nasal congestion Hematologic/Lymphatic: bleeding problems, blood clots or swollen lymph nodes Endocrine: temperature intolerance or unexpected weight changes Breast: new or changing breast lumps or nipple discharge Resp: cough, shortness of breath, or wheezing CV: chest pain or dyspnea on exertion GI: as per HPI GU: dysuria, trouble voiding, or hematuria MSK: joint pain or joint stiffness Neuro: TIA or stroke symptoms Derm: pruritus and skin lesion changes Psych: anxiety and depression  Objective   PE There were no vitals taken for this visit. Constitutional: NAD; conversant; no deformities Eyes: Moist conjunctiva; no lid lag; anicteric; PERRL Neck: Trachea midline; no thyromegaly Lungs: Normal respiratory effort; no tactile fremitus CV: RRR; no palpable thrills; no pitting edema GI: Abd Soft, nontender; no palpable hepatosplenomegaly MSK: Normal range of motion of extremities; no clubbing/cyanosis Psychiatric: Appropriate affect; alert and oriented x3 Lymphatic: No palpable cervical or axillary lymphadenopathy  No results found for this or any previous visit (from the past 24 hour(s)).  Imaging Orders  No imaging studies ordered today     Assessment and Plan   Olivia Hodges is an 45 y.o. female with morbid obesity (BMI 52) and the following comorbidities related to obesity: sleep  apnea, hyperlipidemia, diabetes (A1C 6.9 on 06/05/20) and hypertension.  She presented for bariatric surgery consultation. The patient attended the information session and is interested in pursuing bariatric surgery. I entered the patient's information into the [facility] bariatric risk-benefit calculator. I used this as a outlined to facilitate our discussion of the risks, and  benefits of bariatric surgery, specifically comparing laparoscopic sleeve gastrectomy versus laparoscopic Roux-en-Y gastric bypass. The patient is interested in pursuing a Roux-en-Y gastric bypass. The preoperative pathway includes:  - Blood work - notable for A1C 6.9, high cholesterol - Nutrition consult - Completed with Sandie Ano, RD - Chest x-ray 07/24/20 - No active cardiopulmonary disease. - EKG 07/25/20 - NSR - Psychology evaluation  - EGD with biopsy.  Today she presents for upper endoscopy.  I will evaluate her forgut anatomy, for acid reflux changes, for hiatal hernia, and for H. Pylori.  The procedure itself as well as its risks, benefits and alternatives were discussed.  After a full discussion and all questions answered the patient granted consent to proceed.  We will proceed as scheduled.   Felicie Morn, MD  [facility] Use [email or web address] to contact on call provider

## 2020-11-07 NOTE — Transfer of Care (Signed)
Immediate Anesthesia Transfer of Care Note  Patient: Joannah Volden  Procedure(s) Performed: ESOPHAGOGASTRODUODENOSCOPY (EGD) BIOPSY  Patient Location: PACU and Endoscopy Unit  Anesthesia Type:MAC  Level of Consciousness: awake, alert , oriented and patient cooperative  Airway & Oxygen Therapy: Patient Spontanous Breathing and Patient connected to face mask oxygen  Post-op Assessment: Report given to RN, Post -op Vital signs reviewed and stable and B/P value similar to preop - value not carrying over into chart.  Post vital signs: Reviewed and stable  Last Vitals:  Vitals Value Taken Time  BP    Temp    Pulse 90 11/07/20 1513  Resp 14 11/07/20 1513  SpO2 99 % 11/07/20 1513  Vitals shown include unvalidated device data.  Last Pain:  Vitals:   11/07/20 1333  TempSrc:   PainSc: 0-No pain         Complications: No notable events documented.

## 2020-11-07 NOTE — Op Note (Signed)
[facility] Patient Name: Olivia Hodges Procedure Date: 11/07/2020 MRN: [id number] Attending MD: Olivia Hodges ,  Date of Birth: [birth year] CSN: [id number] Age: 45 Admit Type: Outpatient Procedure:                Upper GI endoscopy Indications:               Providers:                Olivia Hodges, Olivia Hodges, Olivia Hodges, Olivia Hodges, Olivia Hodges, Olivia Hodges Referring MD:              Medicines:                Monitored Anesthesia Care Complications:            No immediate complications. Estimated Blood Loss:     Estimated blood loss was minimal. Procedure:                Pre-Anesthesia Assessment:                           - Prior to the procedure, a History and Physical                            was performed, and patient medications and                            allergies were reviewed. The patient is competent.                            The risks and benefits of the procedure and the                            sedation options and risks were discussed with the                            patient. All questions were answered and informed                            consent was obtained. Patient identification and                            proposed procedure were verified by the physician                            in the pre-procedure area. Mental Status                            Examination: alert and oriented. Airway                            Examination: normal oropharyngeal airway and neck                            mobility. Respiratory Examination:  clear to                            auscultation. CV Examination: normal. ASA Grade                            Assessment: II - A patient with mild systemic                            disease. After reviewing the risks and benefits,                            the patient was deemed in satisfactory condition to                            undergo the procedure. The  anesthesia plan was to                            use monitored anesthesia care (MAC). Immediately                            prior to administration of medications, the patient                            was re-assessed for adequacy to receive sedatives.                            The heart rate, respiratory rate, oxygen                            saturations, blood pressure, adequacy of pulmonary                            ventilation, and response to care were monitored                            throughout the procedure. The physical status of                            the patient was re-assessed after the procedure.                           After obtaining informed consent, the endoscope was                            passed under direct vision. Throughout the                            procedure, the patient's blood pressure, pulse, and                            oxygen saturations were monitored continuously. The  [id number]-H190 ([id number]) Olympus endoscope was introduced                            through the mouth, and advanced to the second part                            of duodenum. The upper GI endoscopy was                            accomplished without difficulty. The patient                            tolerated the procedure well. Findings:      The examined esophagus was normal.      The gastroesophageal flap valve was visualized endoscopically and       classified as Hill Grade I (prominent fold, tight to endoscope).      The entire examined stomach was normal. Biopsies were taken with a cold       forceps for histology. Verification of patient identification for the       specimen was done. Biopsies were taken with a cold forceps for       Helicobacter pylori testing.      The in the duodenum was normal. Impression:               - Normal esophagus.                           - Gastroesophageal flap valve classified as Hill                             Grade I (prominent fold, tight to endoscope).                           - Normal stomach. Biopsied.                           - Normal.                           - Appears an appropriate candidate for gastric                            bypass. Moderate Sedation:      Moderate (conscious) sedation was personally administered by an       anesthesia professional. The following parameters were monitored: oxygen       saturation, heart rate, blood pressure, and response to care. Total       physician intraservice time was 15 minutes. Recommendation:           - Await pathology results. Procedure Code(s):        --- Professional ---                           [id number], Esophagogastroduodenoscopy, flexible,                            transoral; with biopsy, single or multiple Diagnosis  Code(s):        --- Professional ---                           G95.621, Encounter for other preprocedural                            examination CPT copyright 2019 American Medical Association. All rights reserved. The codes documented in this report are preliminary and upon coder review may  be revised to meet current compliance requirements. Olivia Hodges,  11/07/2020 3:11:45 PM This report has been signed electronically. Number of Addenda: 0

## 2020-11-07 NOTE — Anesthesia Preprocedure Evaluation (Signed)
Anesthesia Evaluation  Patient identified by MRN, date of birth, ID band Patient awake    Reviewed: Allergy & Precautions, NPO status , Patient's Chart, lab work & pertinent test results  Airway Mallampati: III  TM Distance: <3 FB Neck ROM: Full    Dental no notable dental hx.    Pulmonary sleep apnea and Continuous Positive Airway Pressure Ventilation ,    Pulmonary exam normal breath sounds clear to auscultation       Cardiovascular hypertension, Normal cardiovascular exam Rhythm:Regular Rate:Normal     Neuro/Psych negative neurological ROS  negative psych ROS   GI/Hepatic negative GI ROS, Neg liver ROS,   Endo/Other  Morbid obesity  Renal/GU negative Renal ROS  negative genitourinary   Musculoskeletal negative musculoskeletal ROS (+)   Abdominal (+) + obese,   Peds negative pediatric ROS (+)  Hematology negative hematology ROS (+)   Anesthesia Other Findings   Reproductive/Obstetrics negative OB ROS                             Anesthesia Physical Anesthesia Plan  ASA: 3  Anesthesia Plan: MAC   Post-op Pain Management:    Induction: Intravenous  PONV Risk Score and Plan: 2 and Propofol infusion and Treatment may vary due to age or medical condition  Airway Management Planned: Simple Face Mask  Additional Equipment:   Intra-op Plan:   Post-operative Plan:   Informed Consent: I have reviewed the patients History and Physical, chart, labs and discussed the procedure including the risks, benefits and alternatives for the proposed anesthesia with the patient or authorized representative who has indicated his/her understanding and acceptance.     Dental advisory given  Plan Discussed with: CRNA and Surgeon  Anesthesia Plan Comments:         Anesthesia Quick Evaluation

## 2020-11-07 NOTE — Anesthesia Procedure Notes (Signed)
Procedure Name: MAC Date/Time: 11/07/2020 2:57 PM Performed by: Lollie Sails, CRNA Pre-anesthesia Checklist: Patient identified, Emergency Drugs available, Suction available, Patient being monitored and Timeout performed Oxygen Delivery Method: Simple face mask Preoxygenation: POM used. Placement Confirmation: positive ETCO2

## 2020-11-08 NOTE — Anesthesia Postprocedure Evaluation (Signed)
Anesthesia Post Note  Patient: Olivia Hodges  Procedure(s) Performed: ESOPHAGOGASTRODUODENOSCOPY (EGD) BIOPSY     Patient location during evaluation: PACU Anesthesia Type: MAC Level of consciousness: awake and alert Pain management: pain level controlled Vital Signs Assessment: post-procedure vital signs reviewed and stable Respiratory status: spontaneous breathing, nonlabored ventilation, respiratory function stable and patient connected to nasal cannula oxygen Cardiovascular status: stable and blood pressure returned to baseline Postop Assessment: no apparent nausea or vomiting Anesthetic complications: no   No notable events documented.  Last Vitals:  Vitals:   11/07/20 1525 11/07/20 1530  BP: (!) 145/89 (!) 165/79  Pulse: 82 77  Resp: (!) 24 12  Temp:    SpO2: 99% 100%    Last Pain:  Vitals:   11/07/20 1514  TempSrc: Oral  PainSc:                  Bernyce Brimley S

## 2020-11-10 ENCOUNTER — Encounter (HOSPITAL_COMMUNITY): Payer: Self-pay | Admitting: Surgery

## 2020-11-10 LAB — SURGICAL PATHOLOGY

## 2020-11-12 ENCOUNTER — Encounter: Payer: Self-pay | Admitting: Medical-Surgical

## 2020-11-12 NOTE — Telephone Encounter (Signed)
Usually there is a standardized templates\d letter that the surgeon's office wants up to complete. See if we can get a copy of that.  We may be able to complete it without having to see her.  We may need to call the surgeon's offoce directly.

## 2020-11-18 ENCOUNTER — Encounter: Payer: Self-pay | Admitting: Family Medicine

## 2020-11-18 ENCOUNTER — Other Ambulatory Visit (HOSPITAL_COMMUNITY): Payer: Self-pay

## 2020-11-18 ENCOUNTER — Ambulatory Visit (INDEPENDENT_AMBULATORY_CARE_PROVIDER_SITE_OTHER): Payer: No Typology Code available for payment source | Admitting: Family Medicine

## 2020-11-18 VITALS — BP 149/87 | HR 70 | Temp 98.0°F | Wt 297.1 lb

## 2020-11-18 DIAGNOSIS — Z6841 Body Mass Index (BMI) 40.0 and over, adult: Secondary | ICD-10-CM

## 2020-11-18 DIAGNOSIS — E119 Type 2 diabetes mellitus without complications: Secondary | ICD-10-CM | POA: Diagnosis not present

## 2020-11-18 DIAGNOSIS — R739 Hyperglycemia, unspecified: Secondary | ICD-10-CM | POA: Diagnosis not present

## 2020-11-18 DIAGNOSIS — E669 Obesity, unspecified: Secondary | ICD-10-CM | POA: Insufficient documentation

## 2020-11-18 LAB — POCT GLYCOSYLATED HEMOGLOBIN (HGB A1C): Hemoglobin A1C: 7.1 % — AB (ref 4.0–5.6)

## 2020-11-18 MED ORDER — METFORMIN HCL ER 500 MG PO TB24
500.0000 mg | ORAL_TABLET | Freq: Every day | ORAL | 11 refills | Status: DC
  Filled 2020-11-18: qty 30, 30d supply, fill #0

## 2020-11-18 NOTE — Patient Instructions (Signed)
Starting metformin Continue diet modifications Letter for surgery printed and added to MyChart

## 2020-11-18 NOTE — Assessment & Plan Note (Signed)
Patient waiting for bariatric surgery to be approved.  Patient provided with letter requesting she be allowed to opt out of 59-monthof telephone calls for lifestyle management discussions prior to surgery.  She would benefit from having surgery as soon as able as this will help her with her glucose control as well as lose weight so that she is able to get her hip replacement and allow her to start exercising.

## 2020-11-18 NOTE — Assessment & Plan Note (Signed)
POCT A1c in office today equals 7.1%.  Previously prediabetic range and watching lifestyle factors.  We will go ahead and start metformin daily.  Encouraged her to continue healthy diet with portion control and limiting carbohydrates.  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  Labs up-to-date this summer.  We will repeat with upcoming physical.

## 2020-11-18 NOTE — Progress Notes (Signed)
Established Patient Office Visit  Subjective:  Patient ID: Olivia Hodges, female    DOB: [birth year]  Age: 45 y.o. MRN: [id number]  CC:  Chief Complaint  Patient presents with   Letter for surgical clearance    HPI Olivia Hodges presents for letter for surgery.   -Patient has already complete surgical clearance/eval, but is needing a letter regarding her diagnosis of pre-diabetes so she can get bariatric surgery. -Last A1c in May 2022 was 6.9% (had been on steroids for hip OA). Rechecking today. She is not on any diabetic medications. No polydipsia, polyuria, polyphagia. -Reports HTN is well managed at home on current regimen - losartan, HCTZ.  Patient is here today to request a letter to opt out of " active health management" program required for 6 months prior to bariatric surgery by her insurance.  She reports that diabetes was not listed on her chart despite being in the prediabetic range.  However she recently had an endoscopy and the provider listed diabetes given her A1c of 6.9.  She states that now her insurance is requesting her to do 6 months of nurse calls to discuss lifestyle changes prior to getting the bariatric surgery to improve results.  However patient has been trying to get the surgery for quite a while.  States she really needs hip replacement but they will not do that until her BMI is improved which is why she is been preparing for bariatric surgery.  She has been a [employer] scientist for 25 years, is trying to eat a healthy diet with limited carbs and control portion sizes as she prepares for bariatric surgery.  She is unable to exercise at this time due to significant pain in her hip requiring hip replacement.  She denies any symptoms of diabetes, polydipsia, polyphagia, polyuria as well as denying chest pain, shortness of breath, headaches, vision changes.   Past Medical History:  Diagnosis Date   High blood pressure    Sleep apnea    Urticaria    (Pressure)    Past Surgical  History:  Procedure Laterality Date   BIOPSY  11/07/2020   Procedure: BIOPSY;  Surgeon: Felicie Morn, MD;  Location: [facility];  Service: General;;   ESOPHAGOGASTRODUODENOSCOPY N/A 11/07/2020   Procedure: ESOPHAGOGASTRODUODENOSCOPY (EGD);  Surgeon: Felicie Morn, MD;  Location: [facility];  Service: General;  Laterality: N/A;    Family History  Problem Relation Age of Onset   High blood pressure Mother    Diabetes Mother    Breast cancer Mother 73   Kidney cancer Mother    Bladder Cancer Mother    Lymphoma Mother    High blood pressure Father    Prostate cancer Father    Breast cancer Sister 65       39   Lymphoma Sister    Diabetes Maternal Grandmother    High blood pressure Paternal Grandmother     Social History   Socioeconomic History   Marital status: Married    Spouse name: Not on file   Number of children: 0   Years of education: Not on file   Highest education level: Not on file  Occupational History   Occupation: RN    Employer: [employer]  Tobacco Use   Smoking status: Never   Smokeless tobacco: Never  Vaping Use   Vaping Use: Never used  Substance and Sexual Activity   Alcohol use: Never   Drug use: Never   Sexual activity: Yes    Partners: Female  Other Topics Concern   Not on file  Social History Narrative   Not on file   Social Determinants of Health   Financial Resource Strain: Not on file  Food Insecurity: Not on file  Transportation Needs: Not on file  Physical Activity: Not on file  Stress: Not on file  Social Connections: Not on file  Intimate Partner Violence: Not on file    Outpatient Medications Prior to Visit  Medication Sig Dispense Refill   acetaminophen (TYLENOL) 500 MG tablet Take 1,000 mg by mouth every 6 (six) hours as needed for moderate pain or headache.     AMBULATORY NON FORMULARY MEDICATION Supply ordered: CPAP and other supplies needed (headgear, cushions, filters, heated tuubing and water  chamber). Patient needs tubing and face mask!  Dx: obstructive sleep apnea Settings: auto-titration 5-20 cmH2O 1 Units 99   hydrochlorothiazide (HYDRODIURIL) 25 MG tablet TAKE 1 TABLET BY MOUTH DAILY (Patient taking differently: Take 25 mg by mouth at bedtime.) 90 tablet 3   levocetirizine (XYZAL) 5 MG tablet Take 1 tablet by mouth every evening. 90 tablet 3   losartan (COZAAR) 100 MG tablet TAKE 1 TABLET BY MOUTH ONCE DAILY (Patient taking differently: Take 100 mg by mouth at bedtime.) 90 tablet 3   meloxicam (MOBIC) 15 MG tablet TAKE 1 TABLET BY MOUTH ONCE DAILY (Patient taking differently: Take 15 mg by mouth at bedtime.) 90 tablet 3   montelukast (SINGULAIR) 10 MG tablet TAKE 1 TABLET BY MOUTH EVERY NIGHT AT BEDTIME 90 tablet 3   tiZANidine (ZANAFLEX) 4 MG tablet TAKE 1 TABLET BY MOUTH EVERY NIGHT AT BEDTIME AS NEEDED FOR MUSCLE SPASMS 90 tablet 3   No facility-administered medications prior to visit.    Allergies  Allergen Reactions   Lisinopril Hives   Sulfa Antibiotics Hives    ROS Review of Systems All review of systems negative except what is listed in the HPI    Objective:    Physical Exam Vitals reviewed.  Constitutional:      Appearance: Normal appearance. She is obese.  HENT:     Head: Normocephalic and atraumatic.  Cardiovascular:     Rate and Rhythm: Normal rate and regular rhythm.  Pulmonary:     Effort: Pulmonary effort is normal.     Breath sounds: Normal breath sounds.  Musculoskeletal:        General: Normal range of motion.     Cervical back: Normal range of motion and neck supple.  Skin:    General: Skin is warm and dry.  Neurological:     General: No focal deficit present.     Mental Status: She is alert and oriented to person, place, and time.  Psychiatric:        Mood and Affect: Mood normal.        Behavior: Behavior normal.        Thought Content: Thought content normal.        Judgment: Judgment normal.    BP (!) 149/87   Pulse 70   Temp  98 F (36.7 C) (Oral)   Wt 297 lb 1.3 oz (134.8 kg)   LMP 11/01/2020   BMI 52.63 kg/m  Wt Readings from Last 3 Encounters:  11/18/20 297 lb 1.3 oz (134.8 kg)  11/07/20 300 lb (136.1 kg)  08/27/20 294 lb 8 oz (133.6 kg)     Health Maintenance Due  Topic Date Due   Pneumococcal Vaccine 54-65 Years old (1 - PCV) Never done   FOOT EXAM  Never done   OPHTHALMOLOGY EXAM  Never done   HIV Screening  Never done   Hepatitis C Screening  Never done   PAP SMEAR-Modifier  Never done   COLONOSCOPY (Pts 45-34yr Insurance coverage will need to be confirmed)  Never done   COVID-19 Vaccine (3 - Booster for Pfizer series) 05/11/2019    There are no preventive care reminders to display for this patient.  Lab Results  Component Value Date   TSH 1.66 06/05/2020   Lab Results  Component Value Date   WBC 8.3 06/05/2020   HGB 15.4 06/05/2020   HCT 45.9 (H) 06/05/2020   MCV 90.9 06/05/2020   PLT 345 06/05/2020   Lab Results  Component Value Date   NA 134 (L) 06/05/2020   K 4.2 06/05/2020   CO2 25 06/05/2020   GLUCOSE 117 (H) 06/05/2020   BUN 13 06/05/2020   CREATININE 0.64 06/05/2020   BILITOT 0.5 06/05/2020   AST 44 (H) 06/05/2020   ALT 55 (H) 06/05/2020   PROT 7.7 06/05/2020   CALCIUM 10.0 06/05/2020   Lab Results  Component Value Date   CHOL 200 (H) 06/05/2020   Lab Results  Component Value Date   HDL 49 (L) 06/05/2020   Lab Results  Component Value Date   LDLCALC 123 (H) 06/05/2020   Lab Results  Component Value Date   TRIG 163 (H) 06/05/2020   Lab Results  Component Value Date   CHOLHDL 4.1 06/05/2020   Lab Results  Component Value Date   HGBA1C 7.1 (A) 11/18/2020      Assessment & Plan:   Controlled type 2 diabetes mellitus without complication, without long-term current use of insulin (HCC) POCT A1c in office today equals 7.1%.  Previously prediabetic range and watching lifestyle factors.  We will go ahead and start metformin daily.  Encouraged her to  continue healthy diet with portion control and limiting carbohydrates.  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  Labs up-to-date this summer.  We will repeat with upcoming physical.  Obesity Patient waiting for bariatric surgery to be approved.  Patient provided with letter requesting she be allowed to opt out of [id number] telephone calls for lifestyle management discussions prior to surgery.  She would benefit from having surgery as soon as able as this will help her with her glucose control as well as lose weight so that she is able to get her hip replacement and allow her to start exercising. Official/medical clearance for surgery has already been obtained by prior PCP.   BP elevated in office today, but well controlled at home. Recommend she continue to monitor at home and let usKoreanow if readings remain >140/90. Continue current regimen and lifestyle/dietary methods.  Discussed case with new PCP, JoSamuel BoucheNP.   Follow-up: Return for routine follow-up and est care as previously scheduled with Joy.    TaTerrilyn SaverNP

## 2020-11-25 ENCOUNTER — Other Ambulatory Visit: Payer: Self-pay | Admitting: *Deleted

## 2020-11-25 NOTE — Patient Outreach (Signed)
[facility]) Care Management  11/25/2020  Paysley Poplar [birth year] [phone]   Transition of care   Referral received : 11/21/20 Insurance: [place]   Received referral for transition of care , note patient surgery date is 12/30/20  Plan Will close case for now and await new referral for transition of care needs. Joylene Draft, RN, BSN  [place] Management Coordinator  [phone]- Mobile [phone]- Toll Free Main Office

## 2020-12-01 ENCOUNTER — Encounter: Payer: No Typology Code available for payment source | Attending: Surgery | Admitting: Skilled Nursing Facility1

## 2020-12-01 ENCOUNTER — Other Ambulatory Visit: Payer: Self-pay

## 2020-12-01 DIAGNOSIS — Z6841 Body Mass Index (BMI) 40.0 and over, adult: Secondary | ICD-10-CM | POA: Insufficient documentation

## 2020-12-01 NOTE — Progress Notes (Signed)
Pre-Operative Nutrition Class:    Patient was seen on 12/01/2020 for Pre-Operative Bariatric Surgery Education at [facility].    Surgery date:  Surgery type: RYGB Start weight at [facility]: 294.3 Weight today: 298.5  Samples given per MNT protocol. Patient educated on appropriate usage: Ensure max exp: March 25, 2021 Ensure max lot: [id number]  Chewable bariatric advantage: advanced multi EA exp: 08/23 Chewable bariatric advantage: advanced multi EA lot: [id number]  Bariatric advantage calcium citrate exp: 02/23 Bariatric advantage calcium citrate lot: [id number]   The following the learning objectives were met by the patient during this course: Identify Pre-Op Dietary Goals and will begin 2 weeks pre-operatively Identify appropriate sources of fluids and proteins  State protein recommendations and appropriate sources pre and post-operatively Identify Post-Operative Dietary Goals and will follow for 2 weeks post-operatively Identify appropriate multivitamin and calcium sources Describe the need for physical activity post-operatively and will follow MD recommendations State when to call healthcare provider regarding medication questions or post-operative complications When having a diagnosis of diabetes understanding hypoglycemia symptoms and the inclusion of 1 complex carbohydrate per meal  Handouts given during class include: Pre-Op Bariatric Surgery Diet Handout Protein Shake Handout Post-Op Bariatric Surgery Nutrition Handout BELT Program Information Flyer Support Group Information Flyer [facility] Bariatric Supplements Price List  Follow-Up Plan: Patient will follow-up at [facility] 2 weeks post operatively for diet advancement per MD.

## 2020-12-05 ENCOUNTER — Ambulatory Visit: Payer: Self-pay | Admitting: Surgery

## 2020-12-05 NOTE — Progress Notes (Signed)
Sent message, via epic in basket, requesting orders in epic from surgeon.  

## 2020-12-10 ENCOUNTER — Other Ambulatory Visit (HOSPITAL_COMMUNITY): Payer: Self-pay

## 2020-12-15 NOTE — Progress Notes (Addendum)
COVID swab appointment: 12/26/20  COVID Vaccine Completed: yes x2 Date COVID Vaccine completed: 02/05/19, 03/16/19 Has received booster: COVID vaccine manufacturer: Pfizer      Date of COVID positive in last 90 days: no  PCP - Samuel Bouche, NP Cardiologist - n/a  Chest x-ray - 07/24/20 Epic EKG - 07/25/20 Epic Stress Test - n/a ECHO -  n/a Cardiac Cath - n/a Pacemaker/ICD device last checked: n/a Spinal Cord Stimulator: n/a  Sleep Study - yes positive CPAP - yes every night, instructed to bring mask and tubing DOS, verbalized understanding  Fasting Blood Sugar - no machine, doesn't check Checks Blood Sugar __ times a day  Blood Thinner Instructions: n/a Aspirin Instructions: Last Dose:  Activity level: Can go up a flight of stairs and perform activities of daily living without stopping and without symptoms of chest pain or shortness of breath.     Anesthesia review: PAT BP 162/95 pt has white coat syndrome. Checks BP at home and reports numbers 140s/80s. Denied any symptoms at PAT appointment. Will continue to monitor BP at home. HTN, DM  Patient denies shortness of breath, fever, cough and chest pain at PAT appointment   Patient verbalized understanding of instructions that were given to them at the PAT appointment. Patient was also instructed that they will need to review over the PAT instructions again at home before surgery.

## 2020-12-15 NOTE — Patient Instructions (Addendum)
DUE TO COVID-19 ONLY ONE VISITOR IS ALLOWED TO COME WITH YOU AND STAY IN THE WAITING ROOM ONLY DURING PRE OP AND PROCEDURE.   **NO VISITORS ARE ALLOWED IN THE SHORT STAY AREA OR RECOVERY ROOM!!**  IF YOU WILL BE ADMITTED INTO THE HOSPITAL YOU ARE ALLOWED ONLY TWO SUPPORT PEOPLE DURING VISITATION HOURS ONLY (7AM -8PM)   The support person(s) may change daily. The support person(s) must pass our screening, gel in and out, and wear a mask at all times, including in the patient's room. Patients must also wear a mask when staff or their support person are in the room.  No visitors under the age of 64. Any visitor under the age of 71 must be accompanied by an adult.    COVID SWAB TESTING MUST BE COMPLETED ON:  12/26/20 **MUST PRESENT COMPLETED FORM AT TESTING SITE**    [facility] (backside of the building) Open 8am-3pm. No appointment needed. You are not required to quarantine, however you are required to wear a well-fitted mask when you are out and around people not in your household.  Hand Hygiene often Do NOT share personal items Notify your provider if you are in close contact with someone who has COVID or you develop fever 100.4 or greater, new onset of sneezing, cough, sore throat, shortness of breath or body aches.       Your procedure is scheduled on: 12/30/20   Report to [facility] Main Entrance    Report to admitting at 8:45 AM   Call this number if you have problems the morning of surgery [phone]   MORNING OF SURGERY DRINK:   DRINK 1 G2 drink BEFORE YOU LEAVE HOME, DRINK ALL OF THE  G2 DRINK AT ONE TIME.   NO SOLID FOOD AFTER 600 PM THE NIGHT BEFORE YOUR SURGERY. YOU MAY DRINK CLEAR FLUIDS. THE G2 DRINK YOU DRINK BEFORE YOU LEAVE HOME WILL BE THE LAST FLUIDS YOU DRINK BEFORE SURGERY.  PAIN IS EXPECTED AFTER SURGERY AND WILL NOT BE COMPLETELY ELIMINATED. AMBULATION AND TYLENOL WILL HELP REDUCE INCISIONAL AND GAS PAIN. MOVEMENT IS KEY!  YOU ARE  EXPECTED TO BE OUT OF BED WITHIN 4 HOURS OF ADMISSION TO YOUR PATIENT ROOM.  SITTING IN THE RECLINER THROUGHOUT THE DAY IS IMPORTANT FOR DRINKING FLUIDS AND MOVING GAS THROUGHOUT THE GI TRACT.  COMPRESSION STOCKINGS SHOULD BE WORN [place] UNLESS YOU ARE WALKING.   INCENTIVE SPIROMETER SHOULD BE USED EVERY HOUR WHILE AWAKE TO DECREASE POST-OPERATIVE COMPLICATIONS SUCH AS PNEUMONIA.  WHEN DISCHARGED HOME, IT IS IMPORTANT TO CONTINUE TO WALK EVERY HOUR AND USE THE INCENTIVE SPIROMETER EVERY HOUR.    May have liquids until 8:00 AM day of surgery  CLEAR LIQUID DIET  Foods Allowed                                                                     Foods Excluded  Water, Black Coffee and tea (no milk or creamer)            liquids that you cannot  Plain Jell-O in any flavor  (No red)  see through such as: Fruit ices (not with fruit pulp)                                            milk, soups, orange juice              Iced Popsicles (No red)                                                All solid food                                   Apple juices Sports drinks like Gatorade (No red) Lightly seasoned clear broth or consume(fat free) Sugar      The day of surgery:  Drink ONE (1) Pre-Surgery G2 by 8:00 am the morning of surgery. Drink in one sitting. Do not sip.  This drink was given to you during your hospital  pre-op appointment visit. Nothing else to drink after completing the  Pre-Surgery G2.          If you have questions, please contact your surgeon's office.     Oral Hygiene is also important to reduce your risk of infection.                                    Remember - BRUSH YOUR TEETH THE MORNING OF SURGERY WITH YOUR REGULAR TOOTHPASTE   Take these medicines the morning of surgery with A SIP OF WATER: Tylenol  DO NOT TAKE ANY ORAL DIABETIC MEDICATIONS DAY OF YOUR SURGERY  How to Manage Your Diabetes Before and  After Surgery  Why is it important to control my blood sugar before and after surgery? Improving blood sugar levels before and after surgery helps healing and can limit problems. A way of improving blood sugar control is eating a healthy diet by:  Eating less sugar and carbohydrates  Increasing activity/exercise  Talking with your doctor about reaching your blood sugar goals High blood sugars (greater than 180 mg/dL) can raise your risk of infections and slow your recovery, so you will need to focus on controlling your diabetes during the weeks before surgery. Make sure that the doctor who takes care of your diabetes knows about your planned surgery including the date and location.  How do I manage my blood sugar before surgery? Check your blood sugar at least 4 times a day, starting 2 days before surgery, to make sure that the level is not too high or low. Check your blood sugar the morning of your surgery when you wake up and every 2 hours until you get to the Short Stay unit. If your blood sugar is less than 70 mg/dL, you will need to treat for low blood sugar: Do not take insulin. Treat a low blood sugar (less than 70 mg/dL) with  cup of clear juice (cranberry or apple), 4 glucose tablets, OR glucose gel. Recheck blood sugar in 15 minutes after treatment (to make sure it is greater than 70 mg/dL). If your blood sugar is not greater than 70 mg/dL on recheck, call [phone] for  further instructions. Report your blood sugar to the short stay nurse when you get to Short Stay.  If you are admitted to the hospital after surgery: Your blood sugar will be checked by the staff and you will probably be given insulin after surgery (instead of oral diabetes medicines) to make sure you have good blood sugar levels. The goal for blood sugar control after surgery is 80-180 mg/dL.   WHAT DO I DO ABOUT MY DIABETES MEDICATION?  Do not take oral diabetes medicines (pills) the morning of  surgery.  THE DAY BEFORE SURGERY, take Metformin as prescribed.       THE MORNING OF SURGERY, do not take Metformin  Reviewed and Endorsed by [facility] Patient Education Committee, August 2015                               You may not have any metal on your body including hair pins, jewelry, and body piercing             Do not wear make-up, lotions, powders, perfumes, or deodorant  Do not wear nail polish including gel and S&S, artificial/acrylic nails, or any other type of covering on natural nails including finger and toenails. If you have artificial nails, gel coating, etc. that needs to be removed by a nail salon please have this removed prior to surgery or surgery may need to be canceled/ delayed if the surgeon/ anesthesia feels like they are unable to be safely monitored.   Do not shave  48 hours prior to surgery.    Do not bring valuables to the hospital. [place].   Contacts, dentures or bridgework may not be worn into surgery.   Bring small overnight bag day of surgery.              Please read over the following fact sheets you were given: IF YOU HAVE QUESTIONS ABOUT YOUR PRE-OP INSTRUCTIONS PLEASE CALL [place] [facility] - Preparing for Surgery Before surgery, you can play an important role.  Because skin is not sterile, your skin needs to be as free of germs as possible.  You can reduce the number of germs on your skin by washing with CHG (chlorahexidine gluconate) soap before surgery.  CHG is an antiseptic cleaner which kills germs and bonds with the skin to continue killing germs even after washing. Please DO NOT use if you have an allergy to CHG or antibacterial soaps.  If your skin becomes reddened/irritated stop using the CHG and inform your nurse when you arrive at Short Stay. Do not shave (including legs and underarms) for at least 48 hours prior to the first CHG shower.  You may shave your  face/neck.  Please follow these instructions carefully:  1.  Shower with CHG Soap the night before surgery and the  morning of surgery.  2.  If you choose to wash your hair, wash your hair first as usual with your normal  shampoo.  3.  After you shampoo, rinse your hair and body thoroughly to remove the shampoo.                             4.  Use CHG as you would any other liquid soap.  You can apply chg  directly to the skin and wash.  Gently with a scrungie or clean washcloth.  5.  Apply the CHG Soap to your body ONLY FROM THE NECK DOWN.   Do   not use on face/ open                           Wound or open sores. Avoid contact with eyes, ears mouth and   genitals (private parts).                       Wash face,  Genitals (private parts) with your normal soap.             6.  Wash thoroughly, paying special attention to the area where your    surgery  will be performed.  7.  Thoroughly rinse your body with warm water from the neck down.  8.  DO NOT shower/wash with your normal soap after using and rinsing off the CHG Soap.                9.  Pat yourself dry with a clean towel.            10.  Wear clean pajamas.            11.  Place clean sheets on your bed the night of your first shower and do not  sleep with pets. Day of Surgery : Do not apply any lotions/deodorants the morning of surgery.  Please wear clean clothes to the hospital/surgery center.  FAILURE TO FOLLOW THESE INSTRUCTIONS MAY RESULT IN THE CANCELLATION OF YOUR SURGERY  PATIENT SIGNATURE_________________________________  NURSE SIGNATURE__________________________________  ________________________________________________________________________   Adam Phenix  An incentive spirometer is a tool that can help keep your lungs clear and active. This tool measures how well you are filling your lungs with each breath. Taking long deep breaths may help reverse or decrease the chance of developing breathing (pulmonary)  problems (especially infection) following: A long period of time when you are unable to move or be active. BEFORE THE PROCEDURE  If the spirometer includes an indicator to show your best effort, your nurse or respiratory therapist will set it to a desired goal. If possible, sit up straight or lean slightly forward. Try not to slouch. Hold the incentive spirometer in an upright position. INSTRUCTIONS FOR USE  Sit on the edge of your bed if possible, or sit up as far as you can in bed or on a chair. Hold the incentive spirometer in an upright position. Breathe out normally. Place the mouthpiece in your mouth and seal your lips tightly around it. Breathe in slowly and as deeply as possible, raising the piston or the ball toward the top of the column. Hold your breath for 3-5 seconds or for as long as possible. Allow the piston or ball to fall to the bottom of the column. Remove the mouthpiece from your mouth and breathe out normally. Rest for a few seconds and repeat Steps 1 through 7 at least 10 times every 1-2 hours when you are awake. Take your time and take a few normal breaths between deep breaths. The spirometer may include an indicator to show your best effort. Use the indicator as a goal to work toward during each repetition. After each set of 10 deep breaths, practice coughing to be sure your lungs are clear. If you have an incision (the cut made at the time of surgery),  support your incision when coughing by placing a pillow or rolled up towels firmly against it. Once you are able to get out of bed, walk around indoors and cough well. You may stop using the incentive spirometer when instructed by your caregiver.  RISKS AND COMPLICATIONS Take your time so you do not get dizzy or light-headed. If you are in pain, you may need to take or ask for pain medication before doing incentive spirometry. It is harder to take a deep breath if you are having pain. AFTER USE Rest and breathe slowly and  easily. It can be helpful to keep track of a log of your progress. Your caregiver can provide you with a simple table to help with this. If you are using the spirometer at home, follow these instructions: [place] IF:  You are having difficultly using the spirometer. You have trouble using the spirometer as often as instructed. Your pain medication is not giving enough relief while using the spirometer. You develop fever of 100.5 F (38.1 C) or higher. SEEK IMMEDIATE MEDICAL CARE IF:  You cough up bloody sputum that had not been present before. You develop fever of 102 F (38.9 C) or greater. You develop worsening pain at or near the incision site. MAKE SURE YOU:  Understand these instructions. Will watch your condition. Will get help right away if you are not doing well or get worse. Document Released: 05/24/2006 Document Revised: 04/05/2011 Document Reviewed: 07/25/2006 Exit[employer] Patient Information 2014 Exit[employer], [place].   ________________________________________________________________________    WHAT IS A BLOOD TRANSFUSION? Blood Transfusion Information  A transfusion is the replacement of blood or some of its parts. Blood is made up of multiple cells which provide different functions. Red blood cells carry oxygen and are used for blood loss replacement. White blood cells fight against infection. Platelets control bleeding. Plasma helps clot blood. Other blood products are available for specialized needs, such as hemophilia or other clotting disorders. BEFORE THE TRANSFUSION  Who gives blood for transfusions?  Healthy volunteers who are fully evaluated to make sure their blood is safe. This is blood bank blood. Transfusion therapy is the safest it has ever been in the practice of medicine. Before blood is taken from a donor, a complete history is taken to make sure that person has no history of diseases nor engages in risky social behavior (examples are intravenous  drug use or sexual activity with multiple partners). The donor's travel history is screened to minimize risk of transmitting infections, such as malaria. The donated blood is tested for signs of infectious diseases, such as HIV and hepatitis. The blood is then tested to be sure it is compatible with you in order to minimize the chance of a transfusion reaction. If you or a relative donates blood, this is often done in anticipation of surgery and is not appropriate for emergency situations. It takes many days to process the donated blood. RISKS AND COMPLICATIONS Although transfusion therapy is very safe and saves many lives, the main dangers of transfusion include:  Getting an infectious disease. Developing a transfusion reaction. This is an allergic reaction to something in the blood you were given. Every precaution is taken to prevent this. The decision to have a blood transfusion has been considered carefully by your caregiver before blood is given. Blood is not given unless the benefits outweigh the risks. AFTER THE TRANSFUSION Right after receiving a blood transfusion, you will usually feel much better and more energetic. This is especially true if  your red blood cells have gotten low (anemic). The transfusion raises the level of the red blood cells which carry oxygen, and this usually causes an energy increase. The nurse administering the transfusion will monitor you carefully for complications. HOME CARE INSTRUCTIONS  No special instructions are needed after a transfusion. You may find your energy is better. Speak with your caregiver about any limitations on activity for underlying diseases you may have. SEEK MEDICAL CARE IF:  Your condition is not improving after your transfusion. You develop redness or irritation at the intravenous (IV) site. SEEK IMMEDIATE MEDICAL CARE IF:  Any of the following symptoms occur over the next 12 hours: Shaking chills. You have a temperature by mouth above 102  F (38.9 C), not controlled by medicine. Chest, back, or muscle pain. People around you feel you are not acting correctly or are confused. Shortness of breath or difficulty breathing. Dizziness and fainting. You get a rash or develop hives. You have a decrease in urine output. Your urine turns a dark color or changes to pink, red, or brown. Any of the following symptoms occur over the next 10 days: You have a temperature by mouth above 102 F (38.9 C), not controlled by medicine. Shortness of breath. Weakness after normal activity. The white part of the eye turns yellow (jaundice). You have a decrease in the amount of urine or are urinating less often. Your urine turns a dark color or changes to pink, red, or brown. Document Released: 01/09/2000 Document Revised: 04/05/2011 Document Reviewed: 08/28/2007 [facility] Patient Information 2014 [place], [place].  _______________________________________________________________________

## 2020-12-16 ENCOUNTER — Encounter (HOSPITAL_COMMUNITY): Payer: Self-pay

## 2020-12-16 ENCOUNTER — Encounter (HOSPITAL_COMMUNITY)
Admission: RE | Admit: 2020-12-16 | Discharge: 2020-12-16 | Disposition: A | Discharge status: Home / Self Care | Payer: No Typology Code available for payment source | Source: Ambulatory Visit | Attending: Surgery | Admitting: Surgery

## 2020-12-16 DIAGNOSIS — Z6841 Body Mass Index (BMI) 40.0 and over, adult: Secondary | ICD-10-CM | POA: Diagnosis not present

## 2020-12-16 DIAGNOSIS — Z01812 Encounter for preprocedural laboratory examination: Secondary | ICD-10-CM | POA: Insufficient documentation

## 2020-12-16 HISTORY — DX: Type 2 diabetes mellitus without complications: E11.9

## 2020-12-16 HISTORY — DX: Other intervertebral disc degeneration, lumbar region: M51.36

## 2020-12-16 HISTORY — DX: Other intervertebral disc degeneration, lumbar region without mention of lumbar back pain or lower extremity pain: M51.369

## 2020-12-16 HISTORY — DX: Unspecified osteoarthritis, unspecified site: M19.90

## 2020-12-16 LAB — COMPREHENSIVE METABOLIC PANEL
ALT: 82 U/L — ABNORMAL HIGH (ref 0–44)
AST: 66 U/L — ABNORMAL HIGH (ref 15–41)
Albumin: 4.4 g/dL (ref 3.5–5.0)
Alkaline Phosphatase: 61 U/L (ref 38–126)
Anion gap: 9 (ref 5–15)
BUN: 12 mg/dL (ref 6–20)
CO2: 26 mmol/L (ref 22–32)
Calcium: 9.7 mg/dL (ref 8.9–10.3)
Chloride: 102 mmol/L (ref 98–111)
Creatinine, Ser: 0.72 mg/dL (ref 0.44–1.00)
GFR, Estimated: >60 mL/min (ref >60)
Glucose, Bld: 130 mg/dL — ABNORMAL HIGH (ref 70–99)
Potassium: 4.2 mmol/L (ref 3.5–5.1)
Sodium: 137 mmol/L (ref 135–145)
Total Bilirubin: 0.7 mg/dL (ref 0.3–1.2)
Total Protein: 8.1 g/dL (ref 6.5–8.1)

## 2020-12-16 LAB — CBC WITH DIFFERENTIAL/PLATELET
Abs Immature Granulocytes: 0.03 10*3/uL (ref 0.00–0.07)
Basophils Absolute: 0 10*3/uL (ref 0.0–0.1)
Basophils Relative: 0 %
Eosinophils Absolute: 0.2 10*3/uL (ref 0.0–0.5)
Eosinophils Relative: 2 %
HCT: 45.8 % (ref 36.0–46.0)
Hemoglobin: 15.3 g/dL — ABNORMAL HIGH (ref 12.0–15.0)
Immature Granulocytes: 0 %
Lymphocytes Relative: 20 %
Lymphs Abs: 1.6 10*3/uL (ref 0.7–4.0)
MCH: 30.7 pg (ref 26.0–34.0)
MCHC: 33.4 g/dL (ref 30.0–36.0)
MCV: 92 fL (ref 80.0–100.0)
Monocytes Absolute: 0.8 10*3/uL (ref 0.1–1.0)
Monocytes Relative: 10 %
Neutro Abs: 5.5 10*3/uL (ref 1.7–7.7)
Neutrophils Relative %: 68 %
Platelets: 299 10*3/uL (ref 150–400)
RBC: 4.98 MIL/uL (ref 3.87–5.11)
RDW: 12.7 % (ref 11.5–15.5)
WBC: 8.1 10*3/uL (ref 4.0–10.5)
nRBC: 0 % (ref 0.0–0.2)

## 2020-12-16 LAB — GLUCOSE, CAPILLARY: Glucose-Capillary: 133 mg/dL — ABNORMAL HIGH (ref 70–99)

## 2020-12-26 ENCOUNTER — Other Ambulatory Visit: Payer: Self-pay | Admitting: Surgery

## 2020-12-26 LAB — SARS CORONAVIRUS 2 (TAT 6-24 HRS): SARS Coronavirus 2: NEGATIVE

## 2020-12-29 NOTE — H&P (Signed)
Admitting Physician: Sachse  Service: Bariatric surgery  CC: Morbid obesity  Subjective   HPI: Olivia Hodges is an 45 y.o. female who is here for elective robotic gastric bypass.  Past Medical History:  Diagnosis Date   Degenerative disc disease, lumbar, cervical    Diabetes mellitus without complication (HCC)    High blood pressure    Osteoarthritis    Sleep apnea    Urticaria    (Pressure)    Past Surgical History:  Procedure Laterality Date   BIOPSY  11/07/2020   Procedure: BIOPSY;  Surgeon: Felicie Morn, MD;  Location: [facility] ENDOSCOPY;  Service: General;;   ESOPHAGOGASTRODUODENOSCOPY N/A 11/07/2020   Procedure: ESOPHAGOGASTRODUODENOSCOPY (EGD);  Surgeon: Felicie Morn, MD;  Location: [facility];  Service: General;  Laterality: N/A;    Family History  Problem Relation Age of Onset   High blood pressure Mother    Diabetes Mother    Breast cancer Mother 58   Kidney cancer Mother    Bladder Cancer Mother    Lymphoma Mother    High blood pressure Father    Prostate cancer Father    Breast cancer Sister 32       39   Lymphoma Sister    Diabetes Maternal Grandmother    High blood pressure Paternal Grandmother     Social:  reports that she has never smoked. She has never used smokeless tobacco. She reports that she does not drink alcohol and does not use drugs.  Allergies:  Allergies  Allergen Reactions   Lisinopril Hives   Sulfa Antibiotics Hives    Medications: Current Outpatient Medications  Medication Instructions   acetaminophen (TYLENOL) 1,000 mg, Oral, Every 6 hours PRN   AMBULATORY NON FORMULARY MEDICATION Supply ordered: CPAP and other supplies needed (headgear, cushions, filters, heated tuubing and water chamber). Patient needs tubing and face mask!  Dx: obstructive sleep apnea Settings: auto-titration 5-20 cmH2O   hydrochlorothiazide (HYDRODIURIL) 25 MG tablet TAKE 1 TABLET BY MOUTH DAILY   levocetirizine (XYZAL) 5 MG  tablet Take 1 tablet by mouth every evening.   losartan (COZAAR) 100 MG tablet TAKE 1 TABLET BY MOUTH ONCE DAILY   melatonin 5 mg, Oral, Daily at bedtime   meloxicam (MOBIC) 15 MG tablet TAKE 1 TABLET BY MOUTH ONCE DAILY   metFORMIN (GLUCOPHAGE XR) 500 mg, Oral, Daily with breakfast   montelukast (SINGULAIR) 10 MG tablet TAKE 1 TABLET BY MOUTH EVERY NIGHT AT BEDTIME   tiZANidine (ZANAFLEX) 4 MG tablet TAKE 1 TABLET BY MOUTH EVERY NIGHT AT BEDTIME AS NEEDED FOR MUSCLE SPASMS    ROS - all of the below systems have been reviewed with the patient and positives are indicated with bold text General: chills, fever or night sweats Eyes: blurry vision or double vision ENT: epistaxis or sore throat Allergy/Immunology: itchy/watery eyes or nasal congestion Hematologic/Lymphatic: bleeding problems, blood clots or swollen lymph nodes Endocrine: temperature intolerance or unexpected weight changes Breast: new or changing breast lumps or nipple discharge Resp: cough, shortness of breath, or wheezing CV: chest pain or dyspnea on exertion GI: as per HPI GU: dysuria, trouble voiding, or hematuria MSK: joint pain or joint stiffness Neuro: TIA or stroke symptoms Derm: pruritus and skin lesion changes Psych: anxiety and depression  Objective   PE Last menstrual period 12/01/2020. Constitutional: NAD; conversant; no deformities Eyes: Moist conjunctiva; no lid lag; anicteric; PERRL Neck: Trachea midline; no thyromegaly Lungs: Normal respiratory effort; no tactile fremitus CV: RRR; no palpable thrills; no  pitting edema GI: Abd Soft, non-tender; no palpable hepatosplenomegaly MSK: Normal range of motion of extremities; no clubbing/cyanosis Psychiatric: Appropriate affect; alert and oriented x3 Lymphatic: No palpable cervical or axillary lymphadenopathy  No results found for this or any previous visit (from the past 24 hour(s)).  Imaging Orders  No imaging studies ordered today     Assessment  and Plan   Olivia Hodges is an 45 y.o. female with morbid obesity (BMI 52) and the following comorbidities related to obesity: sleep apnea, hyperlipidemia, diabetes (A1C 6.9 on 06/05/20) and hypertension.   She presented for bariatric surgery consultation. The patient attended the information session and is interested in pursuing bariatric surgery. I entered the patient's information into the [facility] bariatric risk-benefit calculator. I used this as a outlined to facilitate our discussion of the risks, and benefits of bariatric surgery, specifically comparing laparoscopic sleeve gastrectomy versus laparoscopic Roux-en-Y gastric bypass. The patient is interested in pursuing a Roux-en-Y gastric bypass. The preoperative pathway includes:   - Blood work - notable for A1C 6.9, high cholesterol - Nutrition consult - Completed with Sandie Ano, RD - Chest x-ray 07/24/20 - No active cardiopulmonary disease. - EKG 07/25/20 - NSR - Psychology evaluation - EGD with biopsy.    Today she presents for elective robotic gastric bypass with upper endoscopy.  We reviewed the risks, benefits and alternatives and the patient granted consent to proceed.  We will proceed as scheduled.  Felicie Morn, MD  [facility] Use [email or web address] to contact on call provider

## 2020-12-30 ENCOUNTER — Other Ambulatory Visit: Payer: Self-pay

## 2020-12-30 ENCOUNTER — Inpatient Hospital Stay (HOSPITAL_COMMUNITY): Payer: No Typology Code available for payment source | Admitting: Physician Assistant

## 2020-12-30 ENCOUNTER — Encounter (HOSPITAL_COMMUNITY): Payer: Self-pay | Admitting: Surgery

## 2020-12-30 ENCOUNTER — Inpatient Hospital Stay (HOSPITAL_COMMUNITY)
Admission: RE | Admit: 2020-12-30 | Discharge: 2020-12-31 | DRG: 621 | Disposition: A | Discharge status: Home / Self Care | Payer: No Typology Code available for payment source | Attending: Surgery | Admitting: Surgery

## 2020-12-30 ENCOUNTER — Encounter (HOSPITAL_COMMUNITY)
Admission: RE | Disposition: A | Discharge status: Home / Self Care | Payer: Self-pay | Source: Home / Self Care | Attending: Surgery

## 2020-12-30 DIAGNOSIS — E7800 Pure hypercholesterolemia, unspecified: Secondary | ICD-10-CM | POA: Diagnosis present

## 2020-12-30 DIAGNOSIS — Z791 Long term (current) use of non-steroidal anti-inflammatories (NSAID): Secondary | ICD-10-CM | POA: Diagnosis not present

## 2020-12-30 DIAGNOSIS — E119 Type 2 diabetes mellitus without complications: Secondary | ICD-10-CM | POA: Diagnosis present

## 2020-12-30 DIAGNOSIS — G4733 Obstructive sleep apnea (adult) (pediatric): Secondary | ICD-10-CM | POA: Diagnosis present

## 2020-12-30 DIAGNOSIS — Z79899 Other long term (current) drug therapy: Secondary | ICD-10-CM | POA: Diagnosis not present

## 2020-12-30 DIAGNOSIS — M1612 Unilateral primary osteoarthritis, left hip: Secondary | ICD-10-CM | POA: Diagnosis present

## 2020-12-30 DIAGNOSIS — Z882 Allergy status to sulfonamides status: Secondary | ICD-10-CM | POA: Diagnosis not present

## 2020-12-30 DIAGNOSIS — Z888 Allergy status to other drugs, medicaments and biological substances status: Secondary | ICD-10-CM

## 2020-12-30 DIAGNOSIS — Z833 Family history of diabetes mellitus: Secondary | ICD-10-CM | POA: Diagnosis not present

## 2020-12-30 DIAGNOSIS — M48061 Spinal stenosis, lumbar region without neurogenic claudication: Secondary | ICD-10-CM | POA: Diagnosis present

## 2020-12-30 DIAGNOSIS — M5030 Other cervical disc degeneration, unspecified cervical region: Secondary | ICD-10-CM | POA: Diagnosis present

## 2020-12-30 DIAGNOSIS — M5136 Other intervertebral disc degeneration, lumbar region: Secondary | ICD-10-CM | POA: Diagnosis present

## 2020-12-30 DIAGNOSIS — Z7984 Long term (current) use of oral hypoglycemic drugs: Secondary | ICD-10-CM | POA: Diagnosis not present

## 2020-12-30 DIAGNOSIS — I10 Essential (primary) hypertension: Secondary | ICD-10-CM | POA: Diagnosis present

## 2020-12-30 DIAGNOSIS — Z6841 Body Mass Index (BMI) 40.0 and over, adult: Secondary | ICD-10-CM | POA: Diagnosis present

## 2020-12-30 LAB — CBC
HCT: 43.6 % (ref 36.0–46.0)
Hemoglobin: 14.8 g/dL (ref 12.0–15.0)
MCH: 31.1 pg (ref 26.0–34.0)
MCHC: 33.9 g/dL (ref 30.0–36.0)
MCV: 91.6 fL (ref 80.0–100.0)
Platelets: 301 10*3/uL (ref 150–400)
RBC: 4.76 MIL/uL (ref 3.87–5.11)
RDW: 12.8 % (ref 11.5–15.5)
WBC: 12.7 10*3/uL — ABNORMAL HIGH (ref 4.0–10.5)
nRBC: 0 % (ref 0.0–0.2)

## 2020-12-30 LAB — TYPE AND SCREEN
ABO/RH(D): A POS
Antibody Screen: NEGATIVE

## 2020-12-30 LAB — PREGNANCY, URINE: Preg Test, Ur: NEGATIVE

## 2020-12-30 LAB — GLUCOSE, CAPILLARY
Glucose-Capillary: 111 mg/dL — ABNORMAL HIGH (ref 70–99)
Glucose-Capillary: 167 mg/dL — ABNORMAL HIGH (ref 70–99)
Glucose-Capillary: 155 mg/dL — ABNORMAL HIGH (ref 70–99)
Glucose-Capillary: 150 mg/dL — ABNORMAL HIGH (ref 70–99)

## 2020-12-30 LAB — CREATININE, SERUM
Creatinine, Ser: 0.76 mg/dL (ref 0.44–1.00)
GFR, Estimated: >60 mL/min (ref >60)

## 2020-12-30 LAB — ABO/RH: ABO/RH(D): A POS

## 2020-12-30 SURGERY — CREATION, GASTRIC BYPASS, ROUX-EN-Y, ROBOT-ASSISTED
Anesthesia: General | Site: Abdomen

## 2020-12-30 MED ORDER — OXYCODONE HCL 5 MG PO TABS: 5.0000 mg | ORAL_TABLET | Freq: Once | ORAL | Status: DC | PRN

## 2020-12-30 MED ORDER — FENTANYL CITRATE (PF) 100 MCG/2ML IJ SOLN
INTRAMUSCULAR | Status: AC
  Filled 2020-12-30: qty 2

## 2020-12-30 MED ORDER — GABAPENTIN 300 MG PO CAPS
300.0000 mg | ORAL_CAPSULE | ORAL | Status: AC
  Administered 2020-12-30: 300 mg via ORAL
  Filled 2020-12-30: qty 1

## 2020-12-30 MED ORDER — DEXAMETHASONE SODIUM PHOSPHATE 10 MG/ML IJ SOLN
INTRAMUSCULAR | Status: AC
  Filled 2020-12-30: qty 1

## 2020-12-30 MED ORDER — ENOXAPARIN SODIUM 30 MG/0.3ML IJ SOSY
30.0000 mg | PREFILLED_SYRINGE | Freq: Two times a day (BID) | INTRAMUSCULAR | Status: DC
  Administered 2020-12-30 – 2020-12-31 (×2): 30 mg via SUBCUTANEOUS
  Filled 2020-12-30 (×2): qty 0.3

## 2020-12-30 MED ORDER — KETAMINE HCL 10 MG/ML IJ SOLN
INTRAMUSCULAR | Status: DC | PRN
  Administered 2020-12-30: 30 mg via INTRAVENOUS

## 2020-12-30 MED ORDER — CHLORHEXIDINE GLUCONATE CLOTH 2 % EX PADS: 6.0000 | MEDICATED_PAD | Freq: Once | CUTANEOUS | Status: DC

## 2020-12-30 MED ORDER — DIPHENHYDRAMINE HCL 50 MG/ML IJ SOLN
INTRAMUSCULAR | Status: DC | PRN
  Administered 2020-12-30: 12.5 mg via INTRAVENOUS

## 2020-12-30 MED ORDER — ENSURE MAX PROTEIN PO LIQD
2.0000 [oz_av] | ORAL | Status: DC
  Administered 2020-12-31 (×2): 2 [oz_av] via ORAL

## 2020-12-30 MED ORDER — SUGAMMADEX SODIUM 500 MG/5ML IV SOLN
INTRAVENOUS | Status: AC
  Filled 2020-12-30: qty 5

## 2020-12-30 MED ORDER — ACETAMINOPHEN 500 MG PO TABS
1000.0000 mg | ORAL_TABLET | Freq: Three times a day (TID) | ORAL | Status: DC
  Administered 2020-12-30 – 2020-12-31 (×2): 1000 mg via ORAL
  Filled 2020-12-30 (×2): qty 2

## 2020-12-30 MED ORDER — SCOPOLAMINE 1 MG/3DAYS TD PT72
MEDICATED_PATCH | TRANSDERMAL | Status: DC | PRN
  Administered 2020-12-30: 1.5 mg via TRANSDERMAL

## 2020-12-30 MED ORDER — MIDAZOLAM HCL 2 MG/2ML IJ SOLN
INTRAMUSCULAR | Status: DC | PRN
  Administered 2020-12-30: 2 mg via INTRAVENOUS

## 2020-12-30 MED ORDER — DEXAMETHASONE SODIUM PHOSPHATE 10 MG/ML IJ SOLN
INTRAMUSCULAR | Status: DC | PRN
  Administered 2020-12-30: 5 mg via INTRAVENOUS

## 2020-12-30 MED ORDER — ONDANSETRON HCL 4 MG/2ML IJ SOLN
INTRAMUSCULAR | Status: AC
  Filled 2020-12-30: qty 2

## 2020-12-30 MED ORDER — CHLORHEXIDINE GLUCONATE 0.12 % MT SOLN
15.0000 mL | Freq: Once | OROMUCOSAL | Status: AC
  Administered 2020-12-30: 15 mL via OROMUCOSAL

## 2020-12-30 MED ORDER — PROPOFOL 10 MG/ML IV BOLUS
INTRAVENOUS | Status: AC
  Filled 2020-12-30: qty 20

## 2020-12-30 MED ORDER — LACTATED RINGERS IV SOLN: INTRAVENOUS | Status: DC

## 2020-12-30 MED ORDER — APREPITANT 40 MG PO CAPS
40.0000 mg | ORAL_CAPSULE | ORAL | Status: AC
  Administered 2020-12-30: 40 mg via ORAL
  Filled 2020-12-30: qty 1

## 2020-12-30 MED ORDER — PROMETHAZINE HCL 25 MG/ML IJ SOLN
INTRAMUSCULAR | Status: AC
  Filled 2020-12-30: qty 1

## 2020-12-30 MED ORDER — BUPIVACAINE-EPINEPHRINE (PF) 0.25% -1:200000 IJ SOLN
INTRAMUSCULAR | Status: AC
  Filled 2020-12-30: qty 30

## 2020-12-30 MED ORDER — ACETAMINOPHEN 500 MG PO TABS
1000.0000 mg | ORAL_TABLET | ORAL | Status: DC
  Filled 2020-12-30: qty 2

## 2020-12-30 MED ORDER — INSULIN ASPART 100 UNIT/ML IJ SOLN: 0.0000 [IU] | Freq: Every day | INTRAMUSCULAR | Status: DC

## 2020-12-30 MED ORDER — ROCURONIUM BROMIDE 10 MG/ML (PF) SYRINGE
PREFILLED_SYRINGE | INTRAVENOUS | Status: AC
  Filled 2020-12-30: qty 10

## 2020-12-30 MED ORDER — ORAL CARE MOUTH RINSE: 15.0000 mL | Freq: Once | OROMUCOSAL | Status: AC

## 2020-12-30 MED ORDER — ONDANSETRON HCL 4 MG/2ML IJ SOLN: 4.0000 mg | INTRAMUSCULAR | Status: DC | PRN

## 2020-12-30 MED ORDER — OXYCODONE HCL 5 MG/5ML PO SOLN: 5.0000 mg | Freq: Four times a day (QID) | ORAL | Status: DC | PRN

## 2020-12-30 MED ORDER — BUPIVACAINE-EPINEPHRINE 0.25% -1:200000 IJ SOLN
INTRAMUSCULAR | Status: DC | PRN
  Administered 2020-12-30: 30 mL

## 2020-12-30 MED ORDER — LACTATED RINGERS IR SOLN
Status: DC | PRN
  Administered 2020-12-30: 1000 mL

## 2020-12-30 MED ORDER — ONDANSETRON HCL 4 MG/2ML IJ SOLN
INTRAMUSCULAR | Status: DC | PRN
  Administered 2020-12-30: 4 mg via INTRAVENOUS

## 2020-12-30 MED ORDER — SODIUM CHLORIDE 0.9 % IV SOLN
2.0000 g | INTRAVENOUS | Status: AC
  Administered 2020-12-30: 2 g via INTRAVENOUS
  Filled 2020-12-30: qty 2

## 2020-12-30 MED ORDER — MORPHINE SULFATE (PF) 2 MG/ML IV SOLN: 1.0000 mg | INTRAVENOUS | Status: DC | PRN

## 2020-12-30 MED ORDER — ENOXAPARIN SODIUM 40 MG/0.4ML IJ SOSY
40.0000 mg | PREFILLED_SYRINGE | INTRAMUSCULAR | Status: AC
  Administered 2020-12-30: 40 mg via SUBCUTANEOUS
  Filled 2020-12-30: qty 0.4

## 2020-12-30 MED ORDER — LEVOCETIRIZINE DIHYDROCHLORIDE 5 MG PO TABS: 5.0000 mg | ORAL_TABLET | Freq: Every day | ORAL | Status: DC

## 2020-12-30 MED ORDER — BUPIVACAINE LIPOSOME 1.3 % IJ SUSP
INTRAMUSCULAR | Status: AC
  Filled 2020-12-30: qty 20

## 2020-12-30 MED ORDER — MONTELUKAST SODIUM 10 MG PO TABS
10.0000 mg | ORAL_TABLET | Freq: Every day | ORAL | Status: DC
  Administered 2020-12-30: 10 mg via ORAL
  Filled 2020-12-30: qty 1

## 2020-12-30 MED ORDER — BUPIVACAINE LIPOSOME 1.3 % IJ SUSP
INTRAMUSCULAR | Status: DC | PRN
  Administered 2020-12-30: 20 mL

## 2020-12-30 MED ORDER — PROPOFOL 10 MG/ML IV BOLUS
INTRAVENOUS | Status: DC | PRN
  Administered 2020-12-30: 200 mg via INTRAVENOUS

## 2020-12-30 MED ORDER — FENTANYL CITRATE PF 50 MCG/ML IJ SOSY
PREFILLED_SYRINGE | INTRAMUSCULAR | Status: AC
  Administered 2020-12-30: 25 ug via INTRAVENOUS
  Filled 2020-12-30: qty 2

## 2020-12-30 MED ORDER — KETAMINE HCL-SODIUM CHLORIDE 100-0.9 MG/10ML-% IV SOSY
PREFILLED_SYRINGE | INTRAVENOUS | Status: AC
  Filled 2020-12-30: qty 10

## 2020-12-30 MED ORDER — ACETAMINOPHEN 160 MG/5ML PO SOLN: 1000.0000 mg | Freq: Three times a day (TID) | ORAL | Status: DC

## 2020-12-30 MED ORDER — 0.9 % SODIUM CHLORIDE (POUR BTL) OPTIME
TOPICAL | Status: DC | PRN
  Administered 2020-12-30: 1000 mL

## 2020-12-30 MED ORDER — SCOPOLAMINE 1 MG/3DAYS TD PT72
MEDICATED_PATCH | TRANSDERMAL | Status: AC
  Filled 2020-12-30: qty 1

## 2020-12-30 MED ORDER — PANTOPRAZOLE SODIUM 40 MG IV SOLR
40.0000 mg | Freq: Every day | INTRAVENOUS | Status: DC
  Administered 2020-12-30: 40 mg via INTRAVENOUS
  Filled 2020-12-30: qty 40

## 2020-12-30 MED ORDER — LOSARTAN POTASSIUM 50 MG PO TABS
100.0000 mg | ORAL_TABLET | Freq: Every day | ORAL | Status: DC
  Administered 2020-12-30: 100 mg via ORAL
  Filled 2020-12-30: qty 2

## 2020-12-30 MED ORDER — SUGAMMADEX SODIUM 500 MG/5ML IV SOLN
INTRAVENOUS | Status: DC | PRN
Start: 2020-12-30 — End: 2020-12-30
  Administered 2020-12-30: 300 mg via INTRAVENOUS

## 2020-12-30 MED ORDER — LIDOCAINE HCL (PF) 2 % IJ SOLN
INTRAMUSCULAR | Status: AC
  Filled 2020-12-30: qty 5

## 2020-12-30 MED ORDER — INSULIN ASPART 100 UNIT/ML IJ SOLN
0.0000 [IU] | Freq: Three times a day (TID) | INTRAMUSCULAR | Status: DC
  Administered 2020-12-30: 3 [IU] via SUBCUTANEOUS
  Administered 2020-12-31: 2 [IU] via SUBCUTANEOUS

## 2020-12-30 MED ORDER — PROMETHAZINE HCL 25 MG/ML IJ SOLN
6.2500 mg | INTRAMUSCULAR | Status: DC | PRN
  Administered 2020-12-30: 6.25 mg via INTRAVENOUS

## 2020-12-30 MED ORDER — SODIUM CHLORIDE (PF) 0.9 % IJ SOLN
INTRAMUSCULAR | Status: AC
  Filled 2020-12-30: qty 10

## 2020-12-30 MED ORDER — BUPIVACAINE LIPOSOME 1.3 % IJ SUSP: 20.0000 mL | Freq: Once | INTRAMUSCULAR | Status: DC

## 2020-12-30 MED ORDER — MELATONIN 5 MG PO TABS
5.0000 mg | ORAL_TABLET | Freq: Every day | ORAL | Status: DC
  Administered 2020-12-30: 5 mg via ORAL
  Filled 2020-12-30: qty 1

## 2020-12-30 MED ORDER — LORATADINE 10 MG PO TABS
10.0000 mg | ORAL_TABLET | Freq: Every day | ORAL | Status: DC
  Administered 2020-12-30: 10 mg via ORAL
  Filled 2020-12-30: qty 1

## 2020-12-30 MED ORDER — MIDAZOLAM HCL 2 MG/2ML IJ SOLN
INTRAMUSCULAR | Status: AC
  Filled 2020-12-30: qty 2

## 2020-12-30 MED ORDER — OXYCODONE HCL 5 MG/5ML PO SOLN: 5.0000 mg | Freq: Once | ORAL | Status: DC | PRN

## 2020-12-30 MED ORDER — LIDOCAINE 2% (20 MG/ML) 5 ML SYRINGE
INTRAMUSCULAR | Status: DC | PRN
  Administered 2020-12-30: 60 mg via INTRAVENOUS

## 2020-12-30 MED ORDER — ROCURONIUM BROMIDE 10 MG/ML (PF) SYRINGE
PREFILLED_SYRINGE | INTRAVENOUS | Status: DC | PRN
  Administered 2020-12-30: 20 mg via INTRAVENOUS
  Administered 2020-12-30: 10 mg via INTRAVENOUS
  Administered 2020-12-30 (×2): 20 mg via INTRAVENOUS
  Administered 2020-12-30: 70 mg via INTRAVENOUS

## 2020-12-30 MED ORDER — SIMETHICONE 80 MG PO CHEW
80.0000 mg | CHEWABLE_TABLET | Freq: Four times a day (QID) | ORAL | Status: DC | PRN
  Administered 2020-12-30: 80 mg via ORAL
  Filled 2020-12-30: qty 1

## 2020-12-30 MED ORDER — FENTANYL CITRATE (PF) 250 MCG/5ML IJ SOLN
INTRAMUSCULAR | Status: AC
  Filled 2020-12-30: qty 5

## 2020-12-30 MED ORDER — FENTANYL CITRATE (PF) 250 MCG/5ML IJ SOLN
INTRAMUSCULAR | Status: DC | PRN
  Administered 2020-12-30: 50 ug via INTRAVENOUS
  Administered 2020-12-30: 100 ug via INTRAVENOUS
  Administered 2020-12-30 (×2): 50 ug via INTRAVENOUS
  Administered 2020-12-30: 100 ug via INTRAVENOUS

## 2020-12-30 MED ORDER — LACTATED RINGERS IV SOLN: INTRAVENOUS | Status: DC | PRN

## 2020-12-30 MED ORDER — FENTANYL CITRATE PF 50 MCG/ML IJ SOSY
25.0000 ug | PREFILLED_SYRINGE | INTRAMUSCULAR | Status: DC | PRN
  Administered 2020-12-30: 25 ug via INTRAVENOUS

## 2020-12-30 MED ORDER — TIZANIDINE HCL 4 MG PO TABS
4.0000 mg | ORAL_TABLET | Freq: Every evening | ORAL | Status: DC | PRN
  Administered 2020-12-30: 4 mg via ORAL
  Filled 2020-12-30: qty 1

## 2020-12-30 SURGICAL SUPPLY — 70 items
APPLIER CLIP 5 13 M/L LIGAMAX5 (MISCELLANEOUS)
APPLIER CLIP ROT 10 11.4 M/L (STAPLE)
BLADE SURG SZ11 CARB STEEL (BLADE) ×2 IMPLANT
CANNULA REDUC XI 12-8 STAPL (CANNULA) ×1
CANNULA REDUCER 12-8 DVNC XI (CANNULA) ×1 IMPLANT
CHLORAPREP W/TINT 26 (MISCELLANEOUS) ×2 IMPLANT
CLIP APPLIE 5 13 M/L LIGAMAX5 (MISCELLANEOUS) IMPLANT
CLIP APPLIE ROT 10 11.4 M/L (STAPLE) IMPLANT
COVER SURGICAL LIGHT HANDLE (MISCELLANEOUS) ×2 IMPLANT
COVER TIP SHEARS 8 DVNC (MISCELLANEOUS) ×1 IMPLANT
COVER TIP SHEARS 8MM DA VINCI (MISCELLANEOUS) ×1
DECANTER SPIKE VIAL GLASS SM (MISCELLANEOUS) ×2 IMPLANT
DERMABOND ADVANCED (GAUZE/BANDAGES/DRESSINGS) ×1
DERMABOND ADVANCED .7 DNX12 (GAUZE/BANDAGES/DRESSINGS) ×1 IMPLANT
DRAPE ARM DVNC X/XI (DISPOSABLE) ×4 IMPLANT
DRAPE COLUMN DVNC XI (DISPOSABLE) ×1 IMPLANT
DRAPE DA VINCI XI ARM (DISPOSABLE) ×4
DRAPE DA VINCI XI COLUMN (DISPOSABLE) ×1
ELECT REM PT RETURN 15FT ADLT (MISCELLANEOUS) ×2 IMPLANT
GAUZE 4X4 16PLY ~~LOC~~+RFID DBL (SPONGE) ×2 IMPLANT
GLOVE SURG ENC MOIS LTX SZ7.5 (GLOVE) ×4 IMPLANT
GLOVE SURG UNDER LTX SZ8 (GLOVE) ×4 IMPLANT
GOWN STRL REUS W/TWL XL LVL3 (GOWN DISPOSABLE) ×6 IMPLANT
GRASPER SUT TROCAR 14GX15 (MISCELLANEOUS) ×2 IMPLANT
IRRIG SUCT STRYKERFLOW 2 WTIP (MISCELLANEOUS) ×2
IRRIGATION SUCT STRKRFLW 2 WTP (MISCELLANEOUS) ×1 IMPLANT
KIT BASIN OR (CUSTOM PROCEDURE TRAY) ×2 IMPLANT
KIT GASTRIC LAVAGE 34FR ADT (SET/KITS/TRAYS/PACK) ×2 IMPLANT
KIT TURNOVER KIT A (KITS) IMPLANT
LUBRICANT JELLY K Y 4OZ (MISCELLANEOUS) IMPLANT
MARKER SKIN DUAL TIP RULER LAB (MISCELLANEOUS) ×2 IMPLANT
MAT PREVALON FULL STRYKER (MISCELLANEOUS) ×2 IMPLANT
NEEDLE SPNL 18GX3.5 QUINCKE PK (NEEDLE) ×2 IMPLANT
OBTURATOR OPTICAL STANDARD 8MM (TROCAR) ×1
OBTURATOR OPTICAL STND 8 DVNC (TROCAR) ×1
OBTURATOR OPTICALSTD 8 DVNC (TROCAR) ×1 IMPLANT
PACK CARDIOVASCULAR III (CUSTOM PROCEDURE TRAY) ×2 IMPLANT
RELOAD STAPLER 2.5X60 WHT DVNC (STAPLE) ×9 IMPLANT
RELOAD STAPLER 3.5X60 BLU DVNC (STAPLE) IMPLANT
SEAL CANN UNIV 5-8 DVNC XI (MISCELLANEOUS) ×3 IMPLANT
SEAL XI 5MM-8MM UNIVERSAL (MISCELLANEOUS) ×3
SEALER SYNCHRO 8 IS4000 DV (MISCELLANEOUS) ×1
SEALER SYNCHRO 8 IS4000 DVNC (MISCELLANEOUS) ×1 IMPLANT
SOL ANTI FOG 6CC (MISCELLANEOUS) ×1 IMPLANT
SOLUTION ANTI FOG 6CC (MISCELLANEOUS) ×1
SOLUTION ELECTROLUBE (MISCELLANEOUS) ×2 IMPLANT
STAPLER 60 DA VINCI SURE FORM (STAPLE) ×1
STAPLER 60 SUREFORM DVNC (STAPLE) ×1 IMPLANT
STAPLER CANNULA SEAL DVNC XI (STAPLE) ×1 IMPLANT
STAPLER CANNULA SEAL XI (STAPLE) ×1
STAPLER RELOAD 2.5X60 WHITE (STAPLE) ×9
STAPLER RELOAD 2.5X60 WHT DVNC (STAPLE) ×9
STAPLER RELOAD 3.5X60 BLU DVNC (STAPLE)
STAPLER RELOAD 3.5X60 BLUE (STAPLE)
SUT ETHIBOND 0 (SUTURE) ×2 IMPLANT
SUT MNCRL AB 4-0 PS2 18 (SUTURE) ×2 IMPLANT
SUT V-LOC BARB 180 2/0GR6 GS22 (SUTURE) ×4
SUT VIC AB 2-0 SH 27 (SUTURE)
SUT VIC AB 2-0 SH 27XBRD (SUTURE) IMPLANT
SUT VICRYL 0 TIES 12 18 (SUTURE) ×2 IMPLANT
SUT VLOC BARB 180 ABS3/0GR12 (SUTURE) ×4
SUTURE V-LC BRB 180 2/0GR6GS22 (SUTURE) ×2 IMPLANT
SUTURE VLOC BRB 180 ABS3/0GR12 (SUTURE) ×2 IMPLANT
SYR 20ML LL LF (SYRINGE) ×2 IMPLANT
TOWEL OR 17X26 10 PK STRL BLUE (TOWEL DISPOSABLE) ×2 IMPLANT
TRAY FOLEY MTR SLVR 16FR STAT (SET/KITS/TRAYS/PACK) IMPLANT
TROCAR ADV FIXATION 12X100MM (TROCAR) ×2 IMPLANT
TROCAR Z-THREAD FIOS 5X100MM (TROCAR) ×2 IMPLANT
TUBE CALIBRATION LAPBAND (TUBING) IMPLANT
TUBING INSUFFLATION 10FT LAP (TUBING) ×2 IMPLANT

## 2020-12-30 NOTE — Anesthesia Procedure Notes (Signed)
Procedure Name: Intubation Date/Time: 12/30/2020 10:48 AM Performed by: Lollie Sails, CRNA Pre-anesthesia Checklist: Patient identified, Emergency Drugs available, Suction available, Patient being monitored and Timeout performed Patient Re-evaluated:Patient Re-evaluated prior to induction Oxygen Delivery Method: Circle system utilized Preoxygenation: Pre-oxygenation with 100% oxygen Induction Type: IV induction Ventilation: Mask ventilation without difficulty Laryngoscope Size: 3 and Glidescope Grade View: Grade I Tube type: Oral Tube size: 7.0 mm Number of attempts: 1 Airway Equipment and Method: Rigid stylet and Video-laryngoscopy Placement Confirmation: ETT inserted through vocal cords under direct vision, positive ETCO2 and breath sounds checked- equal and bilateral Secured at: 22 cm Tube secured with: Tape Dental Injury: Teeth and Oropharynx as per pre-operative assessment  Comments: Gauze placed on lip to protect lip from pressure of tape.

## 2020-12-30 NOTE — Progress Notes (Signed)
PHARMACY CONSULT FOR:  Risk Assessment for Post-Discharge VTE Following Bariatric Surgery  Post-Discharge VTE Risk Assessment: This patient's probability of 30-day post-discharge VTE is increased due to the factors marked:   Female    Age >/=60 years  X  BMI >/=50 kg/m2    CHF    Dyspnea at Rest    Paraplegia  X  Non-gastric-band surgery    Operation Time >/=3 hr    Return to OR     Length of Stay >/= 3 d   Hx of VTE   Hypercoagulable condition   Significant venous stasis       Predicted probability of 30-day post-discharge VTE: 0.27%  Other patient-specific factors to consider: N/A  Recommendation for Discharge: No pharmacologic prophylaxis post-discharge   Olivia Hodges is a 45 y.o. 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  No results for input(s): WBC, HGB, HCT, PLT, APTT, CREATININE, LABCREA, CREATININE, CREAT24HRUR, MG, PHOS, ALBUMIN, PROT, ALBUMIN, AST, ALT, ALKPHOS, BILITOT, BILIDIR, IBILI in the last 72 hours. Estimated Creatinine Clearance: 113.1 mL/min (by C-G formula based on SCr of 0.72 mg/dL).    Past Medical History:  Diagnosis Date   Degenerative disc disease, lumbar, cervical    Diabetes mellitus without complication (HCC)    High blood pressure    Osteoarthritis    Sleep apnea    Urticaria    (Pressure)     Medications Prior to Admission  Medication Sig Dispense Refill Last Dose   acetaminophen (TYLENOL) 500 MG tablet Take 1,000 mg by mouth every 6 (six) hours as needed for moderate pain or headache.   12/30/2020 at 0630   hydrochlorothiazide (HYDRODIURIL) 25 MG tablet TAKE 1 TABLET BY MOUTH DAILY 90 tablet 3 12/29/2020 at 1000   levocetirizine (XYZAL) 5 MG tablet Take 1 tablet by mouth every evening. (Patient taking differently: Take 5 mg by mouth  at bedtime.) 90 tablet 3 12/29/2020 at 2030   losartan (COZAAR) 100 MG tablet TAKE 1 TABLET BY MOUTH ONCE DAILY (Patient taking differently: Take 100 mg by mouth at bedtime.) 90 tablet 3 12/29/2020 at 2030   melatonin 5 MG TABS Take 5 mg by mouth at bedtime.      meloxicam (MOBIC) 15 MG tablet TAKE 1 TABLET BY MOUTH ONCE DAILY (Patient taking differently: Take 15 mg by mouth at bedtime.) 90 tablet 3 12/29/2020 at 2030   metFORMIN (GLUCOPHAGE XR) 500 MG 24 hr tablet Take 1 tablet (500 mg total) by mouth daily with breakfast. 30 tablet 11 12/29/2020 at 1000   montelukast (SINGULAIR) 10 MG tablet TAKE 1 TABLET BY MOUTH EVERY NIGHT AT BEDTIME 90 tablet 3 12/29/2020 at 2030   tiZANidine (ZANAFLEX) 4 MG tablet TAKE 1 TABLET BY MOUTH EVERY NIGHT AT BEDTIME AS NEEDED FOR MUSCLE SPASMS (Patient taking differently: Take 4 mg by mouth at bedtime.) 90 tablet 3 12/29/2020 at [place] ordered: CPAP and other supplies needed (headgear, cushions, filters, heated tuubing and water chamber). Patient needs tubing and face mask!  Dx: obstructive sleep apnea Settings: auto-titration 5-20 cmH2O 1 Units 99    Eudelia Bunch, Pharm.D 12/30/2020 1:51 PM

## 2020-12-30 NOTE — Progress Notes (Signed)
Discussed QI "Goals for Discharge" document with patient including ambulation in halls, Incentive Spirometry use every hour, and oral care.  Also discussed pain and nausea control.  Enabled or verified head of bed 30 degree alarm activated.  BSTOP education provided including BSTOP information guide, "Guide for Pain Management after your Bariatric Procedure".  Diet progression education provided including "Bariatric Surgery Post-Op Food Plan Phase 1: Liquids".  Questions answered.  Will continue to partner with bedside RN and follow up with patient per protocol.

## 2020-12-30 NOTE — Anesthesia Postprocedure Evaluation (Signed)
Anesthesia Post Note  Patient: Olivia Hodges  Procedure(s) Performed: XI ROBOT ASSISTED GASTRIC BYPASS ROUX-EN-Y WITH UPPER ENDO (Abdomen)     Patient location during evaluation: PACU Anesthesia Type: General Level of consciousness: awake and alert Pain management: pain level controlled Vital Signs Assessment: post-procedure vital signs reviewed and stable Respiratory status: spontaneous breathing, nonlabored ventilation, respiratory function stable and patient connected to nasal cannula oxygen Cardiovascular status: blood pressure returned to baseline and stable Postop Assessment: no apparent nausea or vomiting Anesthetic complications: no   No notable events documented.  Last Vitals:  Vitals:   12/30/20 1530 12/30/20 1535  BP:    Pulse:  87  Resp:  (!) 21  Temp: 36.7 C   SpO2: 92% 97%    Last Pain:  Vitals:   12/30/20 1535  TempSrc:   PainSc: [place]

## 2020-12-30 NOTE — Anesthesia Preprocedure Evaluation (Addendum)
Anesthesia Evaluation  Patient identified by MRN, date of birth, ID band Patient awake    Reviewed: Allergy & Precautions, NPO status , Patient's Chart, lab work & pertinent test results  History of Anesthesia Complications Negative for: history of anesthetic complications  Airway Mallampati: II  TM Distance: >3 FB Neck ROM: Full    Dental  (+) Dental Advisory Given, Chipped   Pulmonary sleep apnea and Continuous Positive Airway Pressure Ventilation ,    Pulmonary exam normal        Cardiovascular hypertension, Pt. on medications Normal cardiovascular exam     Neuro/Psych negative neurological ROS  negative psych ROS   GI/Hepatic negative GI ROS, Neg liver ROS,   Endo/Other  diabetes, Type 2, Oral Hypoglycemic AgentsMorbid obesity  Renal/GU negative Renal ROS     Musculoskeletal  (+) Arthritis ,   Abdominal   Peds  Hematology negative hematology ROS (+)   Anesthesia Other Findings Pressure urticaria   Reproductive/Obstetrics                            Anesthesia Physical Anesthesia Plan  ASA: 3  Anesthesia Plan: General   Post-op Pain Management: Tylenol PO (pre-op)   Induction: Intravenous  PONV Risk Score and Plan: 4 or greater and Treatment may vary due to age or medical condition, Ondansetron, Midazolam, Scopolamine patch - Pre-op, Dexamethasone and Aprepitant  Airway Management Planned: Oral ETT and Video Laryngoscope Planned  Additional Equipment: None  Intra-op Plan:   Post-operative Plan: Extubation in OR  Informed Consent: I have reviewed the patients History and Physical, chart, labs and discussed the procedure including the risks, benefits and alternatives for the proposed anesthesia with the patient or authorized representative who has indicated his/her understanding and acceptance.     Dental advisory given  Plan Discussed with: CRNA and  Anesthesiologist  Anesthesia Plan Comments:        Anesthesia Quick Evaluation

## 2020-12-30 NOTE — Transfer of Care (Signed)
Immediate Anesthesia Transfer of Care Note  Patient: Olivia Hodges  Procedure(s) Performed: XI ROBOT ASSISTED GASTRIC BYPASS ROUX-EN-Y WITH UPPER ENDO (Abdomen)  Patient Location: PACU  Anesthesia Type:General  Level of Consciousness: awake, sedated, patient cooperative and responds to stimulation  Airway & Oxygen Therapy: Patient Spontanous Breathing and Patient connected to face mask oxygen  Post-op Assessment: Report given to RN and Post -op Vital signs reviewed and stable  Post vital signs: Reviewed and stable  Last Vitals:  Vitals Value Taken Time  BP 154/91 12/30/20 1338  Temp    Pulse 82 12/30/20 1340  Resp 21 12/30/20 1340  SpO2 100 % 12/30/20 1340  Vitals shown include unvalidated device data.  Last Pain:  Vitals:   12/30/20 0838  TempSrc: Oral  PainSc: 4       Patients Stated Pain Goal: 4 (47/84/12 8208)  Complications: No notable events documented.

## 2020-12-30 NOTE — Op Note (Signed)
Patient: Olivia Hodges ([birth year], [id number])  Date of Surgery: 12/30/2020   Preoperative Diagnosis: Morbid obesity   Postoperative Diagnosis: Morbid obesity   Surgical Procedure: XI ROBOT ASSISTED GASTRIC BYPASS ROUX-EN-Y WITH UPPER ENDO: 09233 (CPT)   Operative Team Members:  Surgeon(s) and Role:    * Brittani Purdum, Nickola Major, MD - Primary    * Johnathan Hausen, MD - Assisting   Anesthesiologist: Audry Pili, MD CRNA: Lollie Sails, CRNA; Milford Cage, CRNA   Anesthesia: General   Fluids:  Total I/O In: 700 [I.V.:600; IV Piggyback:100] Out: 25 [AQTMA:26]  Complications: None  Drains:  none   Specimen: none  Disposition:  PACU - hemodynamically stable.  Plan of Care: Admit to inpatient     Indications for Procedure: Olivia Hodges is an 45 y.o. female with morbid obesity (BMI 52) and the following comorbidities related to obesity: sleep apnea, hyperlipidemia, diabetes (A1C 6.9 on 06/05/20) and hypertension.   She presented for bariatric surgery consultation. The patient attended the information session and is interested in pursuing bariatric surgery. I entered the patient's information into the [facility] bariatric risk-benefit calculator. I used this as a outlined to facilitate our discussion of the risks, and benefits of bariatric surgery, specifically comparing laparoscopic sleeve gastrectomy versus laparoscopic Roux-en-Y gastric bypass. The patient is interested in pursuing a Roux-en-Y gastric bypass. The preoperative pathway includes:   - Blood work - notable for A1C 6.9, high cholesterol - Nutrition consult - Completed with Sandie Ano, RD - Chest x-ray 07/24/20 - No active cardiopulmonary disease. - EKG 07/25/20 - NSR - Psychology evaluation - EGD with biopsy.     Today she presents for elective robotic gastric bypass with upper endoscopy.  We reviewed the risks, benefits and alternatives and the patient granted consent to proceed.  We will proceed as  scheduled.  Findings: Normal anatomy  Infection status: Patient: Private Patient Elective Case Case: Elective Infection Present At Time Of Surgery (PATOS): Some spillage of foregut  and jejunal contents while creating anastomoses   Description of Procedure:   On the date stated above, the patient was taken to the operating room suite and placed in supine positioning.  General endotracheal anesthesia was induced.  A timeout was completed verifying the correct patient, procedure, positioning and equipment needed for the case.  The patient's abdomen was prepped and draped in the usual sterile fashion.  A 5 mm trocar was inserted 22 cm down from the xiphoid and 4-6 cm to the left of midline using the optical technique.  There was no trauma to the underlying viscera with initial trocar placement.  Four robotic trocars were placed across the abdomen at this level.  The robotic stapling trocar was placed in the right most position.  The 5 mm trocar was replaced at this level with a 8 mm robotic trocar.  The [facility] liver retractor was placed through the subxiphoid region and under the left lobe of the liver and was connected to the rail of the bed.  A TAP block was placed using marcaine and Exparel under direct vision of the laparoscope.  The [place] was docked and we transitioned to robotic surgery.  Using the tip up grasper, fenestrated bipolar, 30 degree camera and Synchroseal from the patient's right to left, we began by dissecting the angle of His off the left crus of the diaphragm.  The adhesions between the stomach, spleen and diaphragm were divided using the Synchroseal to define the angle of His.  I then started 4-6 cm down on the lesser curve of the stomach and created a defect in the gastrohepatic ligament tracking behind the lesser curve of the stomach to enter the lesser sac.  I then used multiple white loads of the robotic 60 mm Sureform linear stapler to form the gastric  pouch.  I then directed my attention to the lower abdomen.  The omentum was divided with the Synchroseal and I identified the ligament of treitz.  The jejunum was run to a point 50 cm from the ligament of Treitz.  This loop of bowel was then brought into the left upper quadrant, over the transverse colon, between the split omentum.  A 2-0 v-loc suture was used to create the posterior outer row of the gastrojejunal anastomosis.  A window was made in the jejunal mesentery and the jejunum was divided just proximal to the gastrojejunal anastomosis using a white load of the robotic 60 mm Sureform linear stapler to divide the roux limb from the hepatobiliary limb.  I then continued to create the gastrojejunal anastomosis.  An approximately 2 cm gastrotomy was made in the pouch and a matching 2 cm enterotomy was created in the roux limb.  Then, 3-0 v-loc was used to create a posterior, inner, full thickness layer of the anastomosis.  A second 3-0 v-loc was used to create an anterior, inner, full thickness layer of the anastomosis.  While sewing the anterior inner layer, the Ewald tube was passed through the anastomosis to ensure patency.  The outer, anterior layer was then created using 2-0 v-loc suture.  At this point the gastrojejunal anastomosis was complete and the Ewald tube was removed.  The roux limb was then run 100 cm from the gastrojejunal anastomosis.  This loop of bowel was brought into the left upper quadrant for anastomosis to the hepatobiliary limb.  Enterotomies were created in both limbs using the monopolar scissors.  A robotic 60 mm white load on the Sureform linear stapler was introduced into both limbs and fired to create the jejunojejunostomy.  A second 38m white load was used to create a W shaped anastomosis connecting the distal roux limb to the common channel to prevent anastomotic stricture.  The anastomosis was inspected, then the common enterotomy was closed using a third 693mwhite load.  The  jejunojejunostomy mesenteric defect was closed using running 0-Ethibond suture.  The retro-roux defect was closed, closing the roux limb mesentery to the transverse mesocolon mesentery using a running 0-Ethibond suture.   I ran the roux limb from the gastrojejunal anastomosis to the jejunojejunostomy and found the anatomy as expected without any twisting of the mesentery.  I then transitioned to endoscopic portion of the procedure.  The adult upper endoscope was inserted into the pouch.  The pouch appeared appropriately sized.  There was good intra-luminal hemostasis.  The endoscope was inserted through the anastomosis into the roux limb.  The anastomosis appeared well formed without any stricture.  The anastomosis was submerged in irrigation in the left upper quadrant and there was no leakage of air bubbles with endoscopic insufflation suggesting a negative leak test and an air tight anastomosis.    The foregut was decompressed with the endoscope and the endoscope was removed.  The small bowel was released by the tip-up grasper and then the robotic instruments were removed and the robot was undocked.  The stapler port in the right abdomen was closed at the fascial level using 0-vicryl on a PMI suture passer.  The  liver retractor was removed under direct vision.  The pneumoperitoneum was evacuated.  The skin was closed using 4-0 Monocryl and Dermabond.  All sponge and needle counts were correct at the end of the case.    Louanna Raw, MD General, Bariatric, & Minimally Invasive Surgery [facility], [place]

## 2020-12-31 ENCOUNTER — Other Ambulatory Visit (HOSPITAL_COMMUNITY): Payer: Self-pay

## 2020-12-31 ENCOUNTER — Other Ambulatory Visit: Payer: Self-pay

## 2020-12-31 DIAGNOSIS — E119 Type 2 diabetes mellitus without complications: Secondary | ICD-10-CM

## 2020-12-31 DIAGNOSIS — I10 Essential (primary) hypertension: Secondary | ICD-10-CM

## 2020-12-31 LAB — CBC WITH DIFFERENTIAL/PLATELET
Abs Immature Granulocytes: 0.04 10*3/uL (ref 0.00–0.07)
Basophils Absolute: 0 10*3/uL (ref 0.0–0.1)
Basophils Relative: 0 %
Eosinophils Absolute: 0 10*3/uL (ref 0.0–0.5)
Eosinophils Relative: 0 %
HCT: 41 % (ref 36.0–46.0)
Hemoglobin: 13.9 g/dL (ref 12.0–15.0)
Immature Granulocytes: 0 %
Lymphocytes Relative: 11 %
Lymphs Abs: 1 10*3/uL (ref 0.7–4.0)
MCH: 31.3 pg (ref 26.0–34.0)
MCHC: 33.9 g/dL (ref 30.0–36.0)
MCV: 92.3 fL (ref 80.0–100.0)
Monocytes Absolute: 0.9 10*3/uL (ref 0.1–1.0)
Monocytes Relative: 10 %
Neutro Abs: 7.2 10*3/uL (ref 1.7–7.7)
Neutrophils Relative %: 79 %
Platelets: 308 10*3/uL (ref 150–400)
RBC: 4.44 MIL/uL (ref 3.87–5.11)
RDW: 12.6 % (ref 11.5–15.5)
WBC: 9.1 10*3/uL (ref 4.0–10.5)
nRBC: 0 % (ref 0.0–0.2)

## 2020-12-31 LAB — GLUCOSE, CAPILLARY: Glucose-Capillary: 127 mg/dL — ABNORMAL HIGH (ref 70–99)

## 2020-12-31 MED ORDER — OXYCODONE HCL 5 MG PO TABS
5.0000 mg | ORAL_TABLET | Freq: Four times a day (QID) | ORAL | 0 refills | Status: DC | PRN
  Filled 2020-12-31: qty 10, 3d supply, fill #0

## 2020-12-31 MED ORDER — PANTOPRAZOLE SODIUM 40 MG PO TBEC
40.0000 mg | DELAYED_RELEASE_TABLET | Freq: Every day | ORAL | 0 refills | Status: DC
  Filled 2020-12-31: qty 90, 90d supply, fill #0

## 2020-12-31 MED ORDER — ONDANSETRON 4 MG PO TBDP
4.0000 mg | ORAL_TABLET | Freq: Four times a day (QID) | ORAL | 0 refills | Status: DC | PRN
  Filled 2020-12-31: qty 20, 5d supply, fill #0

## 2020-12-31 MED ORDER — ACETAMINOPHEN 500 MG PO TABS: 1000.0000 mg | ORAL_TABLET | Freq: Three times a day (TID) | ORAL | 0 refills | Status: AC

## 2020-12-31 NOTE — Progress Notes (Signed)
Patient alert and oriented, pain is controlled. Patient is tolerating fluids, advanced to protein shake today, patient is tolerating well. Reviewed Gastric Bypass discharge instructions with patient and patient is able to articulate understanding. Provided information on BELT program, Support Group and [facility] outpatient pharmacy. All questions answered, will continue to monitor.

## 2020-12-31 NOTE — Discharge Instructions (Signed)
GASTRIC BYPASS / [place] Instructions  These instructions are to help you care for yourself when you go home.  Call: If you have any problems. Call [phone] and ask for the surgeon on call If you have an emergency related to your surgery please use the ER at [facility].  Tell the ER staff that you are a new post-op gastric bypass or gastric sleeve patient   Signs and symptoms to report: Severe vomiting or nausea If you cannot handle clear liquids for longer than 1 day, call your surgeon  Abdominal pain which does not get better after taking your pain medication Fever greater than 100.4 F and chills Heart rate over 100 beats a minute Trouble breathing Chest pain  Redness, swelling, drainage, or foul odor at incision (surgical) sites  If your incisions open or pull apart Swelling or pain in calf (lower leg) Diarrhea (Loose bowel movements that happen often), frequent watery, uncontrolled bowel movements Constipation, (no bowel movements for 3 days) if this happens:  Take Milk of Magnesia, 2 tablespoons by mouth, 3 times a day for 2 days if needed Stop taking Milk of Magnesia once you have had a bowel movement Call your doctor if constipation continues Or Take Miralax  (instead of Milk of Magnesia) following the label instructions Stop taking Miralax once you have had a bowel movement Call your doctor if constipation continues Anything you think is "abnormal for you"   Normal side effects after surgery: Unable to sleep at night or unable to concentrate Irritability Being tearful (crying) or depressed These are common complaints, possibly related to your anesthesia, stress of surgery and change in lifestyle, that usually go away a few weeks after surgery.  If these feelings continue, call your medical doctor.  Wound Care: You may have surgical glue, steri-strips, or staples over your incisions after surgery Surgical glue:  Looks like a clear film over your incisions  and will wear off a little at a time Steri-strips : Adhesive strips of tape over your incisions. You may notice a yellowish color on the skin under the steri-strips. This is used to make the   steri-strips stick better. Do not pull the steri-strips off - let them fall off Staples: Staples may be removed before you leave the hospital If you go home with staples, call [facility] at for an appointment with your surgeon's nurse to have staples removed 10 days after surgery, [phone] Showering: You may shower two (2) days after your surgery unless your surgeon tells you differently Wash gently around incisions with warm soapy water, rinse well, and gently pat dry  If you have a drain (tube from your incision), you may need someone to hold this while you shower  No tub baths until staples are removed and incisions are healed     Medications: Medications should be liquid or crushed if larger than the size of a dime Extended release pills (medication that releases a little bit at a time through the day) should not be crushed Depending on the size and number of medications you take, you may need to space (take a few throughout the day)/change the time you take your medications so that you do not over-fill your pouch (smaller stomach) Make sure you follow-up with your primary care physician to make medication changes needed during rapid weight loss and life-style changes If you have diabetes, follow up with the doctor that orders your diabetes medication(s) within one week after surgery and check  your blood sugar regularly. Do not drive while taking narcotics (pain medications) DO NOT take NSAID'S (Examples of NSAID's include ibuprofen, naproxen)  Diet:                    First 2 Weeks  You will see the nutritionist about two (2) weeks after your surgery. The nutritionist will increase the types of foods you can eat if you are handling liquids well: If you have severe vomiting or nausea  and cannot handle clear liquids lasting longer than 1 day, call your surgeon  Protein Shake Drink at least 2 ounces of shake 5-6 times per day Each serving of protein shakes (usually 8 - 12 ounces) should have a minimum of:  15 grams of protein  And no more than 5 grams of carbohydrate  Goal for protein each day: Men = 80 grams per day Women = 60 grams per day Protein powder may be added to fluids such as non-fat milk or Lactaid milk or Soy milk (limit to 35 grams added protein powder per serving)  Hydration Slowly increase the amount of water and other clear liquids as tolerated (See Acceptable Fluids) Slowly increase the amount of protein shake as tolerated   Sip fluids slowly and throughout the day May use sugar substitutes in small amounts (no more than 6 - 8 packets per day; i.e. Splenda)  Fluid Goal The first goal is to drink at least 8 ounces of protein shake/drink per day (or as directed by the nutritionist);  See handout from pre-op Bariatric Education Class for examples of protein shake/drink.   Slowly increase the amount of protein shake you drink as tolerated You may find it easier to slowly sip shakes throughout the day It is important to get your proteins in first Your fluid goal is to drink 64 - 100 ounces of fluid daily It may take a few weeks to build up to this 32 oz (or more) should be clear liquids  And  32 oz (or more) should be full liquids (see below for examples) Liquids should not contain sugar, caffeine, or carbonation  Clear Liquids: Water or Sugar-free flavored water (i.e. Fruit H2O, Propel) Decaffeinated coffee or tea (sugar-free) Suly Vukelich Lite, Wyler's Lite, Minute Maid Lite Sugar-free Jell-O Bouillon or broth Sugar-free Popsicle:   *Less than 20 calories each; Limit 1 per day  Full Liquids: Protein Shakes/Drinks + 2 choices per day of other full liquids Full liquids must be: No More Than 12 grams of Carbs per serving  No More Than 3 grams of Fat  per serving Strained low-fat cream soup Non-Fat milk Fat-free Lactaid Milk Sugar-free yogurt (Dannon Lite & Fit, Greek yogurt)      Vitamins and Minerals Start 1 day after surgery unless otherwise directed by your surgeon Bariatric Specific Complete Multivitamins Chewable Calcium Citrate with Vitamin D-3 (Example: 3 Chewable Calcium Plus 600 with Vitamin D-3) Take 500 mg three (3) times a day for a total of 1500 mg each day Do not take all 3 doses of calcium at one time as it may cause constipation, and you can only absorb 500 mg  at a time  Do not mix multivitamins containing iron with calcium supplements; take 2 hours apart  Menstruating women and those at risk for anemia (a blood disease that causes weakness) may need extra iron Talk with your doctor to see if you need more iron If you need extra iron: Total daily Iron recommendation (including Vitamins) is 50 to 100  mg Iron/day Do not stop taking or change any vitamins or minerals until you talk to your nutritionist or surgeon Your nutritionist and/or surgeon must approve all vitamin and mineral supplements   Activity and Exercise: It is important to continue walking at home.  Limit your physical activity as instructed by your doctor.  During this time, use these guidelines: Do not lift anything greater than ten (10) pounds for at least two (2) weeks Do not go back to work or drive until Engineer, production says you can You may have sex when you feel comfortable  It is VERY important for female patients to use a reliable birth control method; fertility often increases after surgery  Do not get pregnant for at least 18 months Start exercising as soon as your doctor tells you that you can Make sure your doctor approves any physical activity Start with a simple walking program Walk 5-15 minutes each day, 7 days per week.  Slowly increase until you are walking 30-45 minutes per day Consider joining our [place] program. [phone] or email  [email or web address]   Special Instructions Things to remember:  Use your CPAP when sleeping if this applies to you, do not stop the use of CPAP unless directed by physician after a sleep study [facility] has a free Bariatric Surgery Support Group that meets monthly, the 3rd Thursday, 6 pm.  Please review discharge information for date and location of this meeting. It is very important to keep all follow up appointments with your surgeon, nutritionist, primary care physician, and behavioral health practitioner After the first year, please follow up with your bariatric surgeon and nutritionist at least once a year in order to maintain best weight loss results   [facility]: [place]: [phone] Bariatric Nurse Coordinator: [phone]

## 2020-12-31 NOTE — Discharge Summary (Signed)
Patient ID: Olivia Hodges [id number] 45 y.o. Jun 10, 1975  12/30/2020  Discharge date and time: 12/31/2020  Admitting Physician: [place]  Discharge Physician: [place]  Admission Diagnoses: Morbid obesity ([place]) [E66.01] Patient Active Problem List   Diagnosis Date Noted   Morbid obesity ([place]) 12/30/2020   Controlled type 2 diabetes mellitus without complication, without long-term current use of insulin ([place]) 11/18/2020   Obesity 11/18/2020   Primary osteoarthritis of left hip 07/01/2020   Spinal stenosis at L4-L5 level 07/01/2020   Anterolisthesis of lumbar spine 07/01/2020   Tear of left acetabular labrum 07/01/2020   Degenerative disc disease, cervical 07/01/2020   OSA on CPAP 06/29/2019   Essential hypertension 12/29/2018     Discharge Diagnoses: Morbid obesity Patient Active Problem List   Diagnosis Date Noted   Morbid obesity ([place]) 12/30/2020   Controlled type 2 diabetes mellitus without complication, without long-term current use of insulin ([place]) 11/18/2020   Obesity 11/18/2020   Primary osteoarthritis of left hip 07/01/2020   Spinal stenosis at L4-L5 level 07/01/2020   Anterolisthesis of lumbar spine 07/01/2020   Tear of left acetabular labrum 07/01/2020   Degenerative disc disease, cervical 07/01/2020   OSA on CPAP 06/29/2019   Essential hypertension 12/29/2018    Operations: Procedure(s): XI ROBOT ASSISTED GASTRIC BYPASS ROUX-EN-Y WITH UPPER ENDO  Admission Condition: good  Discharged Condition: good  Indication for Admission: Morbid obesity  Hospital Course: Ms. Brickhouse presented for elective robotic gastric bypass with upper endoscopy.  She recovered well and was discharged the following day.  Consults: None  Significant Diagnostic Studies: None  Treatments: surgery: as above  Disposition: Home  Patient Instructions:  Allergies as of 12/31/2020       Reactions   Lisinopril Hives   Sulfa Antibiotics Hives         Medication List     STOP taking these medications    meloxicam 15 MG tablet Commonly known as: MOBIC       TAKE these medications    acetaminophen 500 MG tablet Commonly known as: TYLENOL Take 1,000 mg by mouth every 6 (six) hours as needed for moderate pain or headache. What changed: Another medication with the same name was added. Make sure you understand how and when to take each.   acetaminophen 500 MG tablet Commonly known as: TYLENOL Take 2 tablets (1,000 mg total) by mouth every 8 (eight) hours for 5 days. What changed: You were already taking a medication with the same name, and this prescription was added. Make sure you understand how and when to take each.   AMBULATORY NON FORMULARY MEDICATION Supply ordered: CPAP and other supplies needed (headgear, cushions, filters, heated tuubing and water chamber). Patient needs tubing and face mask!  Dx: obstructive sleep apnea Settings: auto-titration 5-20 cmH2O   hydrochlorothiazide 25 MG tablet Commonly known as: HYDRODIURIL TAKE 1 TABLET BY MOUTH DAILY Notes to patient: Monitor Blood Pressure Daily and keep a log for primary care physician.  Monitor for symptoms of dehydration.  You may need to make changes to your medications with rapid weight loss.     levocetirizine 5 MG tablet Commonly known as: XYZAL Take 1 tablet by mouth every evening. What changed: when to take this   losartan 100 MG tablet Commonly known as: COZAAR TAKE 1 TABLET BY MOUTH ONCE DAILY What changed: when to take this   melatonin 5 MG Tabs Take 5 mg by mouth at bedtime.   metFORMIN 500 MG 24 hr tablet Commonly known  as: Glucophage XR Take 1 tablet (500 mg total) by mouth daily with breakfast. Notes to patient: Monitor Blood Pressure Daily and keep a log for primary care physician.  You may need to make changes to your medications with rapid weight loss.     montelukast 10 MG tablet Commonly known as: SINGULAIR TAKE 1 TABLET BY MOUTH EVERY  NIGHT AT BEDTIME   ondansetron 4 MG disintegrating tablet Commonly known as: ZOFRAN-ODT Take 1 tablet (4 mg total) by mouth every 6 (six) hours as needed for nausea or vomiting.   oxyCODONE 5 MG immediate release tablet Commonly known as: Oxy IR/ROXICODONE Take 1 tablet (5 mg total) by mouth every 6 (six) hours as needed for severe pain.   pantoprazole 40 MG tablet Commonly known as: PROTONIX Take 1 tablet (40 mg total) by mouth daily.   tiZANidine 4 MG tablet Commonly known as: ZANAFLEX TAKE 1 TABLET BY MOUTH EVERY NIGHT AT BEDTIME AS NEEDED FOR MUSCLE SPASMS What changed:  how much to take how to take this when to take this        Activity: no heavy lifting for 4 weeks Diet: Bariatric diet protocol Wound Care: keep wound clean and dry  Follow-up:  With Dr. Thermon Leyland per bariatric follow up protocol.  Signed: Nickola Major Liliauna Santoni [facility] Bariatric, [facility] Minimally Invasive [facility], [place]   12/31/2020, 9:39 AM

## 2020-12-31 NOTE — Progress Notes (Signed)
24hr fluid recall: 536m.  Per dehydration protocol, will call pt to f/u within one week post op.

## 2020-12-31 NOTE — Progress Notes (Signed)
Discharge instructions given to patient and all questions were answered.

## 2021-01-01 ENCOUNTER — Other Ambulatory Visit (HOSPITAL_COMMUNITY): Payer: Self-pay

## 2021-01-01 ENCOUNTER — Telehealth: Payer: Self-pay | Admitting: *Deleted

## 2021-01-01 NOTE — Chronic Care Management (AMB) (Signed)
  Care Management   Note  01/01/2021 Name: Olivia Hodges MRN: [id number] DOB: Jul 04, 1975  Olivia Hodges is a 45 y.o. year old female who is a primary care patient of Samuel Bouche, NP. I reached out to Olivia Hodges by phone today in response to a referral sent by Ms. Olivia Hodges's primary care provider.   Olivia Hodges was given information about care management services today including:  Care management services include personalized support from designated clinical staff supervised by her physician, including individualized plan of care and coordination with other care providers 24/7 contact phone numbers for assistance for urgent and routine care needs. The patient may stop care management services at any time by phone call to the office staff.  Patient agreed to services and verbal consent obtained.   Follow up plan: Telephone appointment with care management team member scheduled for: 01/21/2021  Olivia Hodges, [place] Management  Direct Dial: [phone]

## 2021-01-06 ENCOUNTER — Telehealth (HOSPITAL_COMMUNITY): Payer: Self-pay | Admitting: *Deleted

## 2021-01-06 NOTE — Telephone Encounter (Signed)
1.  Tell me about your pain and pain management? Pt denies any pain.  2.  Let's talk about fluid intake.  How much total fluid are you taking in? Pt states that she is getting in at least 64oz of fluid including protein shakes, bottled water. Pt instructed to assess status and suggestions daily utilizing Hydration Action Plan on discharge folder and to call [facility] if in the "red zone".   3.  How much protein have you taken in the last 2 days? Pt states she is meeting her goal of 60g of protein each day with the protein shakes.  4.  Have you had nausea?  Tell me about when have experienced nausea and what you did to help? Pt denies nausea.   5.  Has the frequency or color changed with your urine? Pt states that she is urinating "fine" with no changes in frequency or urgency.     6.  Tell me what your incisions look like? "Incisions look fine". Pt denies a fever, chills.  Pt states incisions are not swollen, open, or draining.  Pt encouraged to call [facility] if incisions change.   7.  Have you been passing gas? BM? Pt states that she is having BMs. Last BM 01/05/21.    8.  If a problem or question were to arise who would you call?  Do you know contact numbers for [place], [facility], and [facility]? Pt denies dehydration symptoms.  Pt can describe s/sx of dehydration.  Pt knows to call [facility] for surgical, [facility] for nutrition, and [place] for non-urgent questions or concerns.   9.  How has the walking going? Pt states she is walking around and able to be active without difficulty.   10. Are you still using your incentive spirometer?  If so, how often? Pt states that she is "not doing it so much, but was consistent until a few days ago". Pt encouraged to use incentive spirometer, at least 10x every hour while awake until she sees the surgeon.  11.  How are your vitamins and calcium going?  How are you taking them? Pt states that she is taking his/her supplements and vitamins without difficulty.   Reminded patient that  the first 30 days post-operatively are important for successful recovery.  Practice good hand hygiene, wearing a mask when appropriate (since optional in most places), and minimizing exposure to people who live outside of the home, especially if they are exhibiting any respiratory, GI, or illness-like symptoms.

## 2021-01-09 ENCOUNTER — Other Ambulatory Visit: Payer: Self-pay | Admitting: *Deleted

## 2021-01-13 ENCOUNTER — Encounter: Payer: No Typology Code available for payment source | Attending: Surgery | Admitting: Skilled Nursing Facility1

## 2021-01-13 ENCOUNTER — Other Ambulatory Visit: Payer: Self-pay

## 2021-01-13 DIAGNOSIS — E119 Type 2 diabetes mellitus without complications: Secondary | ICD-10-CM | POA: Diagnosis not present

## 2021-01-14 NOTE — Progress Notes (Signed)
2 Week Post-Operative Nutrition Class   Patient was seen on 01/13/2021 for Post-Operative Nutrition education at the [facility].    Surgery date: 12/30/2020 Surgery type: RYGB Start weight at [facility]: 294.3 Weight today: 267.1 Bowel Habits: Every day to every other day no complaints      The following the learning objectives were met by the patient during this course: Identifies Phase 3 (Soft, High Proteins) Dietary Goals and will begin from 2 weeks post-operatively to 2 months post-operatively Identifies appropriate sources of fluids and proteins  Identifies appropriate fat sources and healthy verses unhealthy fat types   States protein recommendations and appropriate sources post-operatively Identifies the need for appropriate texture modifications, mastication, and bite sizes when consuming solids Identifies appropriate fat consumption and sources Identifies appropriate multivitamin and calcium sources post-operatively Describes the need for physical activity post-operatively and will follow MD recommendations States when to call healthcare provider regarding medication questions or post-operative complications   Handouts given during class include: Phase 3A: Soft, High Protein Diet Handout Phase 3 High Protein Meals Healthy Fats   Follow-Up Plan: Patient will follow-up at [facility] in 6 weeks for 2 month post-op nutrition visit for diet advancement per MD.

## 2021-01-20 ENCOUNTER — Telehealth: Payer: Self-pay | Admitting: Skilled Nursing Facility1

## 2021-01-20 ENCOUNTER — Encounter: Payer: Self-pay | Admitting: Medical-Surgical

## 2021-01-20 ENCOUNTER — Encounter: Payer: Self-pay | Admitting: *Deleted

## 2021-01-20 ENCOUNTER — Other Ambulatory Visit: Payer: Self-pay | Admitting: *Deleted

## 2021-01-20 ENCOUNTER — Other Ambulatory Visit: Payer: Self-pay

## 2021-01-20 NOTE — Patient Outreach (Signed)
[facility]) Care Management  01/20/2021  Olivia Hodges 01-20-1976 [id number]   [place] coordination- collaboration with Embedded  RN CM   Outreach from Lajean Saver, RN CM  Collaboration on warm transfer of services for patient to [employer] for this week  [place] Brick to update patient   Plan Transition of care case closure - transfer to [employer]. Lavina Hamman, RN, BSN, [place] Coordinator Office number [phone] Mobile number [phone]  Main [facility] number [phone] Fax number [phone]

## 2021-01-20 NOTE — Patient Outreach (Signed)
[place]) Care Management  01/20/2021 Late entry for 01/09/21   Olivia Hodges [birth year] [id number]   [facility] outreach to post hospital patient- week 1  [name] was referred to [facility] on 12/31/20 after her discharge on 12/31/20 after surgical procedure for morbid obesity   Follow transition of care outreach week 1 to patient  She reports she is doing very well and today she is out of the home completing errands She is very jovial She denies any worsening symptoms, care coordination or disease management needs  She is very appreciative of the outreach     Plan: The patient has been provided with contact information for the care management team and has been advised to call with any health related questions or concerns.  The care management team will reach out to the patient again over the next 7-14 business  days.   Devion Chriscoe L. Lavina Hamman, RN, BSN, [place] Coordinator Office number [phone] Main [facility] number [phone] Fax number [phone]

## 2021-01-20 NOTE — Patient Outreach (Signed)
[facility] [facility]) Care Management Telephonic RN Care Manager Note   01/20/2021 Name:  Olivia Hodges MRN:  [id number] DOB:  October 06, 1975  Summary: Follow transition of care outreach week 2 to patient  She reports again shei s doing very well and again today she is out and about out of the home   She denies any abdominal pain, constipation, nausea, vomiting, pain She does state she has not loss any weight but is maintaining and was expecting it plateau She will be following up with her dietitian today She and RN Cm discussed maintaining home management of her fluids, proteins and vitamins She voiced understanding  No social determinants of health (SDOH), care coordination nor disease management needs identified   She anticipates returning to work soon  Recommendations/Changes made from today's visit: none   Subjective: Olivia Hodges is an 45 y.o. year old female who is a primary patient of Olivia Bouche, NP. The care management team was consulted for assistance with care management and/or care coordination needs.   Admit 12/30/2020 8:11 Discharged 12/31/20  Dx.: Morbid (severe) obesity due to excess calories from  [place] completed Telephone Visit today.   Objective:  Medications Reviewed Today     Reviewed by Olivia Faster, RN (Registered Nurse) on 01/20/21 at 1333  Med List Status: <None>   Medication Order Taking? Sig Documenting Provider Last Dose Status Informant  acetaminophen (TYLENOL) 500 MG tablet [id number]  Take 1,000 mg by mouth every 6 (six) hours as needed for moderate pain or headache. [provider]  Active Self  AMBULATORY NON FORMULARY MEDICATION [id number]  Supply ordered: CPAP and other supplies needed (headgear, cushions, filters, heated tuubing and water chamber). Patient needs tubing and face mask!  Dx: obstructive sleep apnea Settings: auto-titration 5-20 cmH2O Emeterio Reeve, DO  Active Self   hydrochlorothiazide (HYDRODIURIL) 25 MG tablet [id number]  TAKE 1 TABLET BY MOUTH DAILY Emeterio Reeve, DO  Active Self  levocetirizine (XYZAL) 5 MG tablet [id number]  Take 1 tablet by mouth every evening.  Patient taking differently: Take 5 mg by mouth at bedtime.   Emeterio Reeve, DO  Active Self  losartan (COZAAR) 100 MG tablet [id number]  TAKE 1 TABLET BY MOUTH ONCE DAILY  Patient taking differently: Take 100 mg by mouth at bedtime.   Emeterio Reeve, DO  Active Self  melatonin 5 MG TABS [id number]  Take 5 mg by mouth at bedtime. [provider]  Active Self  metFORMIN (GLUCOPHAGE XR) 500 MG 24 hr tablet [id number]  Take 1 tablet (500 mg total) by mouth daily with breakfast. Olivia Saver, NP  Active Self  montelukast (SINGULAIR) 10 MG tablet [id number]  TAKE 1 TABLET BY MOUTH EVERY NIGHT AT BEDTIME Emeterio Reeve, DO  Active Self  ondansetron (ZOFRAN-ODT) 4 MG disintegrating tablet [id number]  Dissolve 1 tablet in mouth every 6 hours as needed for nausea or vomiting. Hodges, Olivia Major, MD  Active   oxyCODONE (OXY IR/ROXICODONE) 5 MG immediate release tablet [id number]  Take 1 tablet by mouth every 6 hours as needed for severe pain. Hodges, Olivia Major, MD  Active   pantoprazole (PROTONIX) 40 MG tablet [id number]  Take 1 tablet by mouth daily. Hodges, Olivia Major, MD  Active   tiZANidine (ZANAFLEX) 4 MG tablet [id number]  TAKE 1 TABLET BY MOUTH EVERY NIGHT AT BEDTIME AS NEEDED FOR MUSCLE SPASMS  Patient taking differently: Take 4 mg by mouth at bedtime.   Emeterio Reeve, DO  Active Self           Patient Active Problem List   Diagnosis Date Noted   Controlled type 2 diabetes mellitus without complication, without long-term current use of insulin ([place]) 11/18/2020   Obesity 11/18/2020   Primary osteoarthritis of left hip 07/01/2020   Anterolisthesis of lumbar spine 07/01/2020   Tear of left acetabular labrum 07/01/2020   Degenerative disc disease, cervical  07/01/2020   Elevated blood-pressure reading, without diagnosis of hypertension 08/29/2019   Radiculopathy, lumbar region 08/02/2019   Body mass index (BMI) 50.0-59.9, adult ([place]) 08/02/2019   Neck pain 08/02/2019   OSA on CPAP 06/29/2019   Essential (primary) hypertension 12/29/2018     SDOH:  (Social Determinants of Health) assessments and interventions performed:  SDOH Interventions    Flowsheet Row Most Recent Value  SDOH Interventions   Food Insecurity Interventions Intervention Not Indicated  Financial Strain Interventions Intervention Not Indicated  Housing Interventions Intervention Not Indicated  Intimate Partner Violence Interventions Intervention Not Indicated  Stress Interventions Intervention Not Indicated  Social Connections Interventions Intervention Not Indicated  Transportation Interventions Intervention Not Indicated       Care Plan  Review of patient past medical history, allergies, medications, health status, including review of consultants reports, laboratory and other test data, was performed as part of comprehensive evaluation for care management services.   Care Plan : RN Care Manager Plan of Care  Updates made by Olivia Faster, RN since 01/20/2021 12:00 AM     Problem: Complex Care Coordination Needs and disease management in patient with morbid obesity, HTN, DM   Priority: High  Onset Date: 01/09/2021     Long-Range Goal: Establish Plan of Care for Management Complex SDOH Barriers, disease management and Care Coordination Needs in patient with morbid obesity, HTN, DM   Start Date: 01/09/2021  This Visit's Progress: On track  Priority: High  Note:   Current Barriers:  Knowledge Deficits related to plan of care for management of HTN, DMII, and morbid obesity  Care Coordination needs related to Limited education about HTN, DM II Morbid obesity* Barriers: Health Behaviors   RNCM Clinical Goal(s):  Patient will verbalize understanding of  plan for management of HTN, DMII, and morbid obesity as evidenced by improved HgA1c, BP, wt values  through collaboration with RN Care manager, provider, and care team.   Interventions: Outreaches for transition of care and then continued assess of care coordination and disease management needs Inter-disciplinary care team collaboration (see longitudinal plan of care) Evaluation of current treatment plan related to  self management and patient's adherence to plan as established by provider   Diabetes Interventions:  (Status:  New goal.) Short Term Goal Assessed patient's understanding of A1c goal: <7% Reviewed medications with patient and discussed importance of medication adherence Discussed plans with patient for ongoing care management follow up and provided patient with direct contact information for care management team Review of patient status, including review of consultants reports, relevant laboratory and other test results, and medications completed Screening for signs and symptoms of depression related to chronic disease state  Assessed social determinant of health barriers Lab Results  Component Value Date   HGBA1C 7.1 (A) 11/18/2020   Hypertension Interventions:  (Status:  New goal. and Goal on track:  NO.) Long Term Goal Last practice recorded BP readings:  BP Readings from Last 3 Encounters:  12/31/20 (!) 155/89  12/16/20 (!) 162/95  11/18/20 (!) 149/87  Most recent eGFR/CrCl: No results found for: EGFR  No components found for: CRCL  Evaluation of current treatment plan related to hypertension self management and patient's adherence to plan as established by provider Reviewed medications with patient and discussed importance of compliance Assessed social determinant of health barriers  Weight Loss Interventions:  (Status:  New goal. and Goal on track:  Yes.) Short Term Goal Advised patient to discuss with primary care provider options regarding weight  management Screening for signs and symptoms of depression Reviewed recommended dietary changes: avoid fad diets, make small/incremental dietary and exercise changes, eat at the table and avoid eating in front of the TV, plan management of cravings, monitor snacking and cravings in food diary Assessed social determinant of health barriers   Patient Goals/Self-Care Activities: check blood sugar at prescribed times: as ordered by MD manage portion size Maintain protein, fluid and vitamin intake  Follow Up Plan:  The patient has been provided with contact information for the care management team and has been advised to call with any health related questions or concerns.  The care management team will reach out to the patient again over the next 7-14 business days.       Plan: The patient has been provided with contact information for the care management team and has been advised to call with any health related questions or concerns.  The care management team will reach out to the patient again over the next 7-14 business  days.  Jonel Weldon L. Lavina Hamman, RN, BSN, [place] Coordinator Office number [phone] Main [facility] number [phone] Fax number [phone]

## 2021-01-20 NOTE — Telephone Encounter (Signed)
RD called pt to verify fluid intake once starting soft, solid proteins 2 week post-bariatric surgery.   Daily Fluid intake: 64 oz Daily Protein intake: 80 g Bowel Habits: every day to every other day  Concerns/issues:   LVM

## 2021-01-21 ENCOUNTER — Ambulatory Visit: Payer: No Typology Code available for payment source

## 2021-01-21 DIAGNOSIS — I10 Essential (primary) hypertension: Secondary | ICD-10-CM

## 2021-01-21 NOTE — Patient Instructions (Signed)
Visit Information  Thank you for taking time to visit with me today. Please don't hesitate to contact me if I can be of assistance to you before our next scheduled telephone appointment.  Following are the goals we discussed today:  Patient Goals/Self-Care Activities: Take medications as prescribed   Attend all scheduled provider appointments Check and record blood pressure readings at home and take with you to your provider visits  Our next appointment is by telephone on 02/04/21 at 3:00 pm  Please call the care guide team at [phone] if you need to cancel or reschedule your appointment.   If you are experiencing a Mental Health or [place] or need someone to talk to, please call the Suicide and Crisis Lifeline: 988 call 1-800-273-TALK (toll free, 24 hour hotline)   Following is a copy of your full plan of care:  Care Plan : [place] of Care  Updates made by Olivia Rued, RN since 01/21/2021 12:00 AM     Problem: Disease Management Education and/or care coordination needs of Chronic Conditions   Priority: High     Long-Range Goal: Development of plan of care for Disease Management and/or care coordination needs   Start Date: 01/21/2021  Expected End Date: 04/21/2021  Priority: High  Note:   Current Barriers: 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 Discharged home on 12/31/20. Olivia reports she is doing well. Denies any problems or concerns at this time. She has a history of HTN. She states she is being evaluated for diabetes and will have a follow up A1C per PCP. She has a follow up appointment scheduled with the surgeon tomorrow 01/22/21, and a follow up with PCP on 01/27/21.  Knowledge Deficits related to plan of care for management of HTN and care post Gastric Bypass.  Chronic Disease Management support and education needs related to HTN and post Gastric Bypass  RNCM Clinical Goal(s):  Patient will verbalize understanding of plan for management of HTN and  care post Gastric Bypass as evidenced by self report, attending provider visits as recommended, taking medications as prescribed, self report and/or chart notation  through collaboration with RN Care manager, provider, and care team.   Interventions: Collaboration with primary care provider regarding development and update of plan of care Inter-disciplinary care team collaboration (see longitudinal plan of care) Evaluation of current treatment plan related to  self management and patient's adherence to plan as established by provider  Transition of care post Gastric Bypass  (Status:  New goal.)  Short Term Goal Discussed plans with patient for ongoing care management follow up and provided patient with direct contact information for care management team Advised patient to follow up with provider with any questions or concerns Reviewed upcoming appointments/confirmed patient has transportation to appointment Medications reviewed, encouraged to take medications as prescribed Encouraged to continue to follow plan outlined by nutritionist/dietician  Hypertension Interventions:  (Status:  New goal.) Long Term Goal Olivia Hodges Reports last home BP reading check was less than 130/90 (could not remember exact number) Last practice recorded BP readings:  BP Readings from Last 3 Encounters:  12/31/20 (!) 155/89  12/16/20 (!) 162/95  11/18/20 (!) 149/87  Reviewed medications with patient and discussed importance of compliance  Encouraged to check and record readings Medications reviewed  Patient Goals/Self-Care Activities: Take medications as prescribed   Attend all scheduled provider appointments Check and record blood pressure readings at home and take with you to your provider visits      Olivia Hodges  was given information about Care Management services by the embedded care coordination team including:  Care Management services include personalized support from designated clinical staff supervised  by her physician, including individualized plan of care and coordination with other care providers 24/7 contact phone numbers for assistance for urgent and routine care needs. The patient may stop CCM services at any time (effective at the end of the month) by phone call to the office staff.  Patient agreed to services and verbal consent obtained.   Patient verbalizes understanding of instructions provided today and agrees to view in [place].   Telephone follow up appointment with care management team member scheduled for: 02/04/21 The patient has been provided with contact information for the care management team and has been advised to call with any health related questions or concerns.   Thea Silversmith, RN, MSN, BSN, CCM Care Management Coordinator [facility] [phone]   Hypertension, Adult Hypertension is another name for high blood pressure. High blood pressure forces your heart to work harder to pump blood. This can cause problems over time. There are two numbers in a blood pressure reading. There is a top number (systolic) over a bottom number (diastolic). It is best to have a blood pressure that is below 120/80. Healthy choices can help lower your blood pressure, or you may need medicine to help lower it. What are the causes? The cause of this condition is not known. Some conditions may be related to high blood pressure. What increases the risk? Smoking. Having type 2 diabetes mellitus, high cholesterol, or both. Not getting enough exercise or physical activity. Being overweight. Having too much fat, sugar, calories, or salt (sodium) in your diet. Drinking too much alcohol. Having long-term (chronic) kidney disease. Having a family history of high blood pressure. Age. Risk increases with age. Race. You may be at higher risk if you are African American. Gender. Men are at higher risk than women before age 34. After age 3, women are at higher risk than men. Having  obstructive sleep apnea. Stress. What are the signs or symptoms? High blood pressure may not cause symptoms. Very high blood pressure (hypertensive crisis) may cause: Headache. Feelings of worry or nervousness (anxiety). Shortness of breath. Nosebleed. A feeling of being sick to your stomach (nausea). Throwing up (vomiting). Changes in how you see. Very bad chest pain. Seizures. How is this treated? This condition is treated by making healthy lifestyle changes, such as: Eating healthy foods. Exercising more. Drinking less alcohol. Your health care provider may prescribe medicine if lifestyle changes are not enough to get your blood pressure under control, and if: Your top number is above 130. Your bottom number is above 80. Your personal target blood pressure may vary. Follow these instructions at home: Eating and drinking  If told, follow the DASH eating plan. To follow this plan: Fill one half of your plate at each meal with fruits and vegetables. Fill one fourth of your plate at each meal with whole grains. Whole grains include whole-wheat pasta, brown rice, and whole-grain bread. Eat or drink low-fat dairy products, such as skim milk or low-fat yogurt. Fill one fourth of your plate at each meal with low-fat (lean) proteins. Low-fat proteins include fish, chicken without skin, eggs, beans, and tofu. Avoid fatty meat, cured and processed meat, or chicken with skin. Avoid pre-made or processed food. Eat less than 1,500 mg of salt each day. Do not drink alcohol if: Your doctor tells you not to drink. You are pregnant,  may be pregnant, or are planning to become pregnant. If you drink alcohol: Limit how much you use to: 0-1 drink a day for women. 0-2 drinks a day for men. Be aware of how much alcohol is in your drink. In the [place] one drink equals one 12 oz bottle of beer (355 mL), one 5 oz glass of wine (148 mL), or one 1 oz glass of hard liquor (44 mL). Lifestyle  Work  with your doctor to stay at a healthy weight or to lose weight. Ask your doctor what the best weight is for you. Get at least 30 minutes of exercise most days of the week. This may include walking, swimming, or biking. Get at least 30 minutes of exercise that strengthens your muscles (resistance exercise) at least 3 days a week. This may include lifting weights or doing Pilates. Do not use any products that contain nicotine or tobacco, such as cigarettes, e-cigarettes, and chewing tobacco. If you need help quitting, ask your doctor. Check your blood pressure at home as told by your doctor. Keep all follow-up visits as told by your doctor. This is important. Medicines Take over-the-counter and prescription medicines only as told by your doctor. Follow directions carefully. Do not skip doses of blood pressure medicine. The medicine does not work as well if you skip doses. Skipping doses also puts you at risk for problems. Ask your doctor about side effects or reactions to medicines that you should watch for. Contact a doctor if you: Think you are having a reaction to the medicine you are taking. Have headaches that keep coming back (recurring). Feel dizzy. Have swelling in your ankles. Have trouble with your vision. Get help right away if you: Get a very bad headache. Start to feel mixed up (confused). Feel weak or numb. Feel faint. Have very bad pain in your: Chest. Belly (abdomen). Throw up more than once. Have trouble breathing. Summary Hypertension is another name for high blood pressure. High blood pressure forces your heart to work harder to pump blood. For most people, a normal blood pressure is less than 120/80. Making healthy choices can help lower blood pressure. If your blood pressure does not get lower with healthy choices, you may need to take medicine. This information is not intended to replace advice given to you by your health care provider. Make sure you discuss any  questions you have with your health care provider. Document Revised: 09/21/2017 Document Reviewed: 09/21/2017 Elsevier Patient Education  [place].

## 2021-01-21 NOTE — Telephone Encounter (Signed)
I've never seen this patient but it looks like the A1c will not be due until the end of January. I'd like to see her if at all possible before we decide what needs to be done.

## 2021-01-21 NOTE — Chronic Care Management (AMB) (Signed)
Care Management    RN Visit Note  01/21/2021 Name: Olivia Hodges MRN: [id number] DOB: [birth year]  Subjective: Olivia Hodges is a 45 y.o. year old female who is a primary care patient of Samuel Bouche, NP. The care management team was consulted for assistance with disease management and care coordination needs.    Engaged with patient by telephone for initial visit in response to provider referral for case management and/or care coordination services.   Consent to Services:   Olivia Hodges was given information about Care Management services today including:  Care Management services includes personalized support from designated clinical staff supervised by her physician, including individualized plan of care and coordination with other care providers 24/7 contact phone numbers for assistance for urgent and routine care needs. The patient may stop case management services at any time by phone call to the office staff.  Patient agreed to services and consent obtained.   Assessment: Review of patient past medical history, allergies, medications, health status, including review of consultants reports, laboratory and other test data, was performed as part of comprehensive evaluation and provision of chronic care management services.   SDOH (Social Determinants of Health) assessments and interventions performed:    Care Plan  Allergies  Allergen Reactions   Lisinopril Hives   Sulfa Antibiotics Hives    Outpatient Encounter Medications as of 01/21/2021  Medication Sig Note   acetaminophen (TYLENOL) 500 MG tablet Take 1,000 mg by mouth every 6 (six) hours as needed for moderate pain or headache.    calcium carbonate (OS-CAL) 1250 (500 Ca) MG chewable tablet Chew 1 tablet by mouth daily.    hydrochlorothiazide (HYDRODIURIL) 25 MG tablet TAKE 1 TABLET BY MOUTH DAILY    levocetirizine (XYZAL) 5 MG tablet Take 1 tablet by mouth every evening. (Patient taking differently: Take 5 mg by mouth at  bedtime.)    losartan (COZAAR) 100 MG tablet TAKE 1 TABLET BY MOUTH ONCE DAILY (Patient taking differently: Take 100 mg by mouth at bedtime.)    melatonin 5 MG TABS Take 5 mg by mouth at bedtime. 01/21/2021: Reports takes as needed   montelukast (SINGULAIR) 10 MG tablet TAKE 1 TABLET BY MOUTH EVERY NIGHT AT BEDTIME    Multiple Vitamin (MULTIVITAMIN) capsule Take 1 capsule by mouth daily.    ondansetron (ZOFRAN-ODT) 4 MG disintegrating tablet Dissolve 1 tablet in mouth every 6 hours as needed for nausea or vomiting.    pantoprazole (PROTONIX) 40 MG tablet Take 1 tablet by mouth daily.    tiZANidine (ZANAFLEX) 4 MG tablet TAKE 1 TABLET BY MOUTH EVERY NIGHT AT BEDTIME AS NEEDED FOR MUSCLE SPASMS (Patient taking differently: Take 4 mg by mouth at bedtime.)    AMBULATORY NON FORMULARY MEDICATION Supply ordered: CPAP and other supplies needed (headgear, cushions, filters, heated tuubing and water chamber). Patient needs tubing and face mask!  Dx: obstructive sleep apnea Settings: auto-titration 5-20 cmH2O    metFORMIN (GLUCOPHAGE XR) 500 MG 24 hr tablet Take 1 tablet (500 mg total) by mouth daily with breakfast. (Patient not taking: Reported on 01/21/2021)    oxyCODONE (OXY IR/ROXICODONE) 5 MG immediate release tablet Take 1 tablet by mouth every 6 hours as needed for severe pain.    No facility-administered encounter medications on file as of 01/21/2021.    Patient Active Problem List   Diagnosis Date Noted   Controlled type 2 diabetes mellitus without complication, without long-term current use of insulin ([place]) 11/18/2020   Obesity 11/18/2020   Primary osteoarthritis of  left hip 07/01/2020   Anterolisthesis of lumbar spine 07/01/2020   Tear of left acetabular labrum 07/01/2020   Degenerative disc disease, cervical 07/01/2020   Elevated blood-pressure reading, without diagnosis of hypertension 08/29/2019   Radiculopathy, lumbar region 08/02/2019   Body mass index (BMI) 50.0-59.9, adult ([place])  08/02/2019   Neck pain 08/02/2019   OSA on CPAP 06/29/2019   Essential (primary) hypertension 12/29/2018    Conditions to be addressed/monitored: HTN and Transition of Care post Gastric Bypass  Care Plan : RN Care Manager Plan of Care  Updates made by Luretha Rued, RN since 01/21/2021 12:00 AM     Problem: Disease Management Education and/or care coordination needs of Chronic Conditions   Priority: High     Long-Range Goal: Development of plan of care for Disease Management and/or care coordination needs   Start Date: 01/21/2021  Expected End Date: 04/21/2021  Priority: High  Note:   Current Barriers: 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 Discharged home on 12/31/20. Olivia Hodges reports she is doing well. Denies any problems or concerns at this time. She has a history of HTN. She states she is being evaluated for diabetes and will have a follow up A1C per PCP. She has a follow up appointment scheduled with the surgeon tomorrow 01/22/21, and a follow up with PCP on 01/27/21.  Knowledge Deficits related to plan of care for management of HTN and care post Gastric Bypass.  Chronic Disease Management support and education needs related to HTN and post Gastric Bypass  RNCM Clinical Goal(s):  Patient will verbalize understanding of plan for management of HTN and care post Gastric Bypass as evidenced by self report, attending provider visits as recommended, taking medications as prescribed, self report and/or chart notation  through collaboration with RN Care manager, provider, and care team.   Interventions: Collaboration with primary care provider regarding development and update of plan of care Inter-disciplinary care team collaboration (see longitudinal plan of care) Evaluation of current treatment plan related to  self management and patient's adherence to plan as established by provider  Transition of care post Gastric Bypass  (Status:  New goal.)  Short Term Goal Discussed plans with patient for  ongoing care management follow up and provided patient with direct contact information for care management team Advised patient to follow up with provider with any questions or concerns Reviewed upcoming appointments/confirmed patient has transportation to appointment Medications reviewed, encouraged to take medications as prescribed Encouraged to continue to follow plan outlined by nutritionist/dietician  Hypertension Interventions:  (Status:  New goal.) Long Term Goal Olivia Hodges Reports last home BP reading check was less than 130/90 (could not remember exact number) Last practice recorded BP readings:  BP Readings from Last 3 Encounters:  12/31/20 (!) 155/89  12/16/20 (!) 162/95  11/18/20 (!) 149/87  Reviewed medications with patient and discussed importance of compliance  Encouraged to check and record readings Medications reviewed  Patient Goals/Self-Care Activities: Take medications as prescribed   Attend all scheduled provider appointments Check and record blood pressure readings at home and take with you to your provider visits    Plan: Telephone follow up appointment with care management team member scheduled for:  02/04/21 The patient has been provided with contact information for the care management team and has been advised to call with any health related questions or concerns.   Thea Silversmith, RN, MSN, BSN, CCM Care Management Coordinator [facility] [phone]

## 2021-01-27 ENCOUNTER — Ambulatory Visit (INDEPENDENT_AMBULATORY_CARE_PROVIDER_SITE_OTHER): Payer: No Typology Code available for payment source | Admitting: Medical-Surgical

## 2021-01-27 ENCOUNTER — Encounter: Payer: Self-pay | Admitting: Medical-Surgical

## 2021-01-27 ENCOUNTER — Other Ambulatory Visit: Payer: Self-pay

## 2021-01-27 VITALS — BP 151/76 | HR 81 | Resp 20 | Ht 63.0 in | Wt 264.0 lb

## 2021-01-27 DIAGNOSIS — I10 Essential (primary) hypertension: Secondary | ICD-10-CM | POA: Diagnosis not present

## 2021-01-27 DIAGNOSIS — Z7689 Persons encountering health services in other specified circumstances: Secondary | ICD-10-CM | POA: Diagnosis not present

## 2021-01-27 DIAGNOSIS — E119 Type 2 diabetes mellitus without complications: Secondary | ICD-10-CM | POA: Diagnosis not present

## 2021-01-27 NOTE — Progress Notes (Deleted)
New Patient Office Visit  Subjective:  Patient ID: Olivia Hodges, female    DOB: [birth year]  Age: 46 y.o. MRN: [id number]  CC: No chief complaint on file.    HPI Olivia Hodges presents to establish care.   Past Medical History:  Diagnosis Date   Degenerative disc disease, lumbar, cervical    Diabetes mellitus without complication (HCC)    High blood pressure    Osteoarthritis    Sleep apnea    Urticaria    (Pressure)    Past Surgical History:  Procedure Laterality Date   BIOPSY  11/07/2020   Procedure: BIOPSY;  Surgeon: Felicie Morn, MD;  Location: [facility];  Service: General;;   ESOPHAGOGASTRODUODENOSCOPY N/A 11/07/2020   Procedure: ESOPHAGOGASTRODUODENOSCOPY (EGD);  Surgeon: Felicie Morn, MD;  Location: [facility];  Service: General;  Laterality: N/A;    Family History  Problem Relation Age of Onset   High blood pressure Mother    Diabetes Mother    Breast cancer Mother 67   Kidney cancer Mother    Bladder Cancer Mother    Lymphoma Mother    High blood pressure Father    Prostate cancer Father    Breast cancer Sister 49       39   Lymphoma Sister    Diabetes Maternal Grandmother    High blood pressure Paternal Grandmother     Social History   Socioeconomic History   Marital status: Married    Spouse name: Asa Lente   Number of children: 0   Years of education: Not on file   Highest education level: Bachelor's degree (e.g., BA, AB, BS)  Occupational History   Occupation: Programmer, multimedia: [employer]  Tobacco Use   Smoking status: Never   Smokeless tobacco: Never  Vaping Use   Vaping Use: Never used  Substance and Sexual Activity   Alcohol use: Never   Drug use: Never   Sexual activity: Yes    Partners: Female  Other Topics Concern   Not on file  Social History Narrative   Not on file   Social Determinants of Health   Financial Resource Strain: Low Risk    Difficulty of Paying Living Expenses: Not hard at all  Food  Insecurity: No Food Insecurity   Worried About Charity fundraiser in the Last Year: Never true   [place] in the Last Year: Never true  Transportation Needs: No Transportation Needs   Lack of Transportation (Medical): No   Lack of Transportation (Non-Medical): No  Physical Activity: Not on file  Stress: No Stress Concern Present   Feeling of Stress : Only a little  Social Connections: Engineer, building services of Communication with Friends and Family: More than three times a week   Frequency of Social Gatherings with Friends and Family: More than three times a week   Attends Religious Services: 1 to 4 times per year   Active Member of [employer] or Organizations: Yes   Attends Archivist Meetings: 1 to 4 times per year   Marital Status: Married  Human resources officer Violence: Not At Risk   Fear of Current or Ex-Partner: No   Emotionally Abused: No   Physically Abused: No   Sexually Abused: No    ROS Review of Systems  Objective:   Today's Vitals: BP (!) 151/76 (BP Location: Left Arm, Patient Position: Sitting, Cuff Size: Normal)    Pulse 81    Resp 20  Ht 5' 3"  (1.6 m)    Wt 264 lb (119.7 kg)    SpO2 100%    BMI 46.77 kg/m   Physical Exam  Assessment & Plan:   1. Encounter to establish care ***   Outpatient Encounter Medications as of 01/27/2021  Medication Sig   acetaminophen (TYLENOL) 500 MG tablet Take 1,000 mg by mouth every 6 (six) hours as needed for moderate pain or headache.   AMBULATORY NON FORMULARY MEDICATION Supply ordered: CPAP and other supplies needed (headgear, cushions, filters, heated tuubing and water chamber). Patient needs tubing and face mask!  Dx: obstructive sleep apnea Settings: auto-titration 5-20 cmH2O   calcium carbonate (OS-CAL) 1250 (500 Ca) MG chewable tablet Chew 1 tablet by mouth daily.   hydrochlorothiazide (HYDRODIURIL) 25 MG tablet TAKE 1 TABLET BY MOUTH DAILY   levocetirizine (XYZAL) 5 MG tablet Take 1 tablet by mouth  every evening. (Patient taking differently: Take 5 mg by mouth at bedtime.)   losartan (COZAAR) 100 MG tablet TAKE 1 TABLET BY MOUTH ONCE DAILY (Patient taking differently: Take 100 mg by mouth at bedtime.)   melatonin 5 MG TABS Take 5 mg by mouth at bedtime.   metFORMIN (GLUCOPHAGE XR) 500 MG 24 hr tablet Take 1 tablet (500 mg total) by mouth daily with breakfast. (Patient not taking: Reported on 01/21/2021)   montelukast (SINGULAIR) 10 MG tablet TAKE 1 TABLET BY MOUTH EVERY NIGHT AT BEDTIME   Multiple Vitamin (MULTIVITAMIN) capsule Take 1 capsule by mouth daily.   ondansetron (ZOFRAN-ODT) 4 MG disintegrating tablet Dissolve 1 tablet in mouth every 6 hours as needed for nausea or vomiting.   pantoprazole (PROTONIX) 40 MG tablet Take 1 tablet by mouth daily.   tiZANidine (ZANAFLEX) 4 MG tablet TAKE 1 TABLET BY MOUTH EVERY NIGHT AT BEDTIME AS NEEDED FOR MUSCLE SPASMS (Patient taking differently: Take 4 mg by mouth at bedtime.)   [DISCONTINUED] oxyCODONE (OXY IR/ROXICODONE) 5 MG immediate release tablet Take 1 tablet by mouth every 6 hours as needed for severe pain.   No facility-administered encounter medications on file as of 01/27/2021.    Follow-up: No follow-ups on file.   Clearnce Sorrel, DNP, APRN, FNP-BC [facility]

## 2021-01-27 NOTE — Progress Notes (Signed)
°  HPI with pertinent ROS:   CC: Transfer of care  HPI: Pleasant 46 year old female presenting today to transfer care to a new PCP.  She recently underwent gastric bypass surgery and is currently following up with her gastric bypass surgeon on a regular basis.  Today she reports that she has been diagnosed with some whitecoat hypertension and her blood pressure is elevated on arrival.  Notes that at home her readings are in the 120s over 80s with her current dose of losartan 100 mg daily.  She is prescribed hydrochlorothiazide but has not taken this medication since she had her gastric bypass surgery.  She has lost a little over 30 pounds since her surgery and has also stopped taking metformin.  Notes that she has had an elevated hemoglobin A1c in the past but they were giving her an opportunity to lose some weight in order to get this under control.  Unfortunately, her A1c has increased to 7.1 which has her diagnosed as type II diabetic.  She is hoping with weight loss, she will return to her normal hemoglobin A1c.  Was originally instructed to do labs for this follow-up visit but she has had close follow-up with labs since her gastric bypass surgery and is not currently due for anything.  I reviewed the past medical history, family history, social history, surgical history, and allergies today and no changes were needed.  Please see the problem list section below in epic for further details.   Physical exam:   General: Well Developed, well nourished, and in no acute distress.  Neuro: Alert and oriented x3.  HEENT: Normocephalic, atraumatic.  Skin: Warm and dry. Cardiac: Regular rate and rhythm, no murmurs rubs or gallops, no lower extremity edema.  Respiratory: Clear to auscultation bilaterally. Not using accessory muscles, speaking in full sentences.  Impression and Recommendations:    1. Encounter to establish care Reviewed available information and discussed care concerns with patient.    2. Essential (primary) hypertension Blood pressure is somewhat elevated but home readings do appear to be within normal limits.  Continue losartan 100 mg daily.  Okay to use hydrochlorothiazide 25 mg daily as needed for blood pressures that are consistently running greater than 130/80 or less.  3. Controlled type 2 diabetes mellitus without complication, without long-term current use of insulin (HCC) Last hemoglobin A1c 7.1%.  She has had some rapid weight loss since this occurred and will not be due for a recheck of her A1c for another month.  There is some concern with stopping metformin however her current diet is protein only and very low calorie so between that and her rapid weight loss, we may see a return to controlled status.  Return in about 6 months (around 07/27/2021) for chronic disease follow up. ___________________________________________ Clearnce Sorrel, DNP, APRN, FNP-BC [facility] [place]

## 2021-01-28 ENCOUNTER — Other Ambulatory Visit (HOSPITAL_COMMUNITY): Payer: Self-pay

## 2021-01-30 ENCOUNTER — Other Ambulatory Visit (HOSPITAL_COMMUNITY): Payer: Self-pay

## 2021-02-04 ENCOUNTER — Ambulatory Visit: Payer: No Typology Code available for payment source

## 2021-02-04 DIAGNOSIS — E119 Type 2 diabetes mellitus without complications: Secondary | ICD-10-CM

## 2021-02-04 DIAGNOSIS — I10 Essential (primary) hypertension: Secondary | ICD-10-CM

## 2021-02-04 NOTE — Chronic Care Management (AMB) (Signed)
Care Management    RN Visit Note  02/04/2021 Name: Olivia Hodges MRN: [id number] DOB: 1975-10-04  Subjective: Olivia Hodges is a 46 y.o. year old female who is a primary care patient of Samuel Bouche, NP. The care management team was consulted for assistance with disease management and care coordination needs.    Engaged with patient by telephone for follow up visit in response to provider referral for case management and/or care coordination services.   Consent to Services:   Olivia Hodges was given information about Care Management services today including:  Care Management services includes personalized support from designated clinical staff supervised by her physician, including individualized plan of care and coordination with other care providers 24/7 contact phone numbers for assistance for urgent and routine care needs. The patient may stop case management services at any time by phone call to the office staff.  Patient agreed to services and consent obtained.   Assessment: Review of patient past medical history, allergies, medications, health status, including review of consultants reports, laboratory and other test data, was performed as part of comprehensive evaluation and provision of chronic care management services.   SDOH (Social Determinants of Health) assessments and interventions performed:    Care Plan  Allergies  Allergen Reactions   Lisinopril Hives   Sulfa Antibiotics Hives    Outpatient Encounter Medications as of 02/04/2021  Medication Sig Note   acetaminophen (TYLENOL) 500 MG tablet Take 1,000 mg by mouth every 6 (six) hours as needed for moderate pain or headache.    calcium carbonate (OS-CAL) 1250 (500 Ca) MG chewable tablet Chew 1 tablet by mouth daily.    hydrochlorothiazide (HYDRODIURIL) 25 MG tablet TAKE 1 TABLET BY MOUTH DAILY    levocetirizine (XYZAL) 5 MG tablet Take 1 tablet by mouth every evening. (Patient taking differently: Take 5 mg by mouth at  bedtime.)    losartan (COZAAR) 100 MG tablet TAKE 1 TABLET BY MOUTH ONCE DAILY (Patient taking differently: Take 100 mg by mouth at bedtime.)    melatonin 5 MG TABS Take 5 mg by mouth at bedtime. 01/21/2021: Reports takes as needed   montelukast (SINGULAIR) 10 MG tablet TAKE 1 TABLET BY MOUTH EVERY NIGHT AT BEDTIME    Multiple Vitamin (MULTIVITAMIN) capsule Take 1 capsule by mouth daily.    ondansetron (ZOFRAN-ODT) 4 MG disintegrating tablet Dissolve 1 tablet in mouth every 6 hours as needed for nausea or vomiting.    pantoprazole (PROTONIX) 40 MG tablet Take 1 tablet by mouth daily.    tiZANidine (ZANAFLEX) 4 MG tablet TAKE 1 TABLET BY MOUTH EVERY NIGHT AT BEDTIME AS NEEDED FOR MUSCLE SPASMS (Patient taking differently: Take 4 mg by mouth at bedtime.)    AMBULATORY NON FORMULARY MEDICATION Supply ordered: CPAP and other supplies needed (headgear, cushions, filters, heated tuubing and water chamber). Patient needs tubing and face mask!  Dx: obstructive sleep apnea Settings: auto-titration 5-20 cmH2O    metFORMIN (GLUCOPHAGE XR) 500 MG 24 hr tablet Take 1 tablet (500 mg total) by mouth daily with breakfast. (Patient not taking: Reported on 01/21/2021)    No facility-administered encounter medications on file as of 02/04/2021.    Patient Active Problem List   Diagnosis Date Noted   Controlled type 2 diabetes mellitus without complication, without long-term current use of insulin ([place]) 11/18/2020   Obesity 11/18/2020   Primary osteoarthritis of left hip 07/01/2020   Anterolisthesis of lumbar spine 07/01/2020   Tear of left acetabular labrum 07/01/2020   Degenerative disc disease,  cervical 07/01/2020   Elevated blood-pressure reading, without diagnosis of hypertension 08/29/2019   Radiculopathy, lumbar region 08/02/2019   Body mass index (BMI) 50.0-59.9, adult ([place]) 08/02/2019   Neck pain 08/02/2019   OSA on CPAP 06/29/2019   Essential (primary) hypertension 12/29/2018    Conditions to be  addressed/monitored: HTN, DMII, and status post Gastric Bypass  Care Plan : RN Care Manager Plan of Care  Updates made by Olivia Rued, RN since 02/04/2021 12:00 AM     Problem: Disease Management Education and/or care coordination needs of Chronic Conditions   Priority: High     Long-Range Goal: Development of plan of care for Disease Management and/or care coordination needs   Start Date: 01/21/2021  Expected End Date: 04/21/2021  Priority: High  Note:   Current Barriers: 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 Olivia Hodges reports she is doing well and has been released to return to work. Olivia Hodges denies any problems. Per chart notation of possible white coat syndrome; checks blood pressures 2-3times/week at home: BP have ranged 120's/80's-130's/80's. Last visit with PCP 01/27/21-following to see impact of weight loss and diet change on A1C. Olivia Hodges reports about a 40 pounds.  Knowledge Deficits related to plan of care for management of HTN and care post Gastric Bypass.  Chronic Disease Management support and education needs related to HTN and post Gastric Bypass  RNCM Clinical Goal(s):  Patient will verbalize understanding of plan for management of HTN and care post Gastric Bypass as evidenced by self report, attending provider visits as recommended, taking medications as prescribed, self report and/or chart notation  through collaboration with RN Care manager, provider, and care team.   Interventions: Collaboration with primary care provider regarding development and update of plan of care Inter-disciplinary care team collaboration (see longitudinal plan of care) Evaluation of current treatment plan related to  self management and patient's adherence to plan as established by provider  Transition of care post Gastric Bypass  (Status:  Goal Met.)  Short Term Goal Discussed plans with patient for ongoing care management follow up and provided patient with direct contact information for care management  team Advised patient to follow up with provider with any questions or concerns Reviewed upcoming appointments/confirmed patient has transportation to appointment Medications reviewed, encouraged to take medications as prescribed Encouraged to continue to follow plan outlined by nutritionist/dietician   Diabetes Interventions:  (Status:  New goal.) Long Term Goal Assessed patient's understanding of A1c goal: <7% Encouraged to continue to follow nutritionist and follow Bariatric plan Medications reviewed  Lab Results  Component Value Date   HGBA1C 7.1 (A) 11/18/2020     Hypertension Interventions:  (Status:  New goal.) Long Term Goal Reports home BP readings 120's/80's-130's/80's Last practice recorded BP readings:  BP Readings from Last 3 Encounters:  01/27/21 (!) 151/76  12/31/20 (!) 155/89  12/16/20 (!) 162/95  Reviewed medications with patient and discussed importance of compliance  Encouraged to continue to check and record BP readings Medications reviewed Reviewed AVS post PCP visit 01/27/21  Patient Goals/Self-Care Activities: Take medications as prescribed   Attend all scheduled provider appointments Continue to check and record blood pressure readings at home and take with you to your provider visits Continue to attend Nutrition counseling as scheduled   Plan: Telephone follow up appointment with care management team member scheduled for:  03/18/21 The patient has been provided with contact information for the care management team and has been advised to call with any health related questions or concerns.  Olivia Silversmith, RN, MSN, BSN, CCM Care Management Coordinator [facility] [phone]

## 2021-02-04 NOTE — Patient Instructions (Addendum)
Visit Information  Thank you for taking time to visit with me today. Please don't hesitate to contact me if I can be of assistance to you before our next scheduled telephone appointment.  Following are the goals we discussed today:  Patient Goals/Self-Care Activities: Take medications as prescribed   Attend all scheduled provider appointments Continue to check and record blood pressure readings at home and take with you to your provider visits Continue to attend Nutrition counseling as scheduled  Our next appointment is by telephone on 03/18/21 at 3:00 pm  Please call the care guide team at [phone] if you need to cancel or reschedule your appointment.   If you are experiencing a Mental Health or [place] or need someone to talk to, please call the Suicide and Crisis Lifeline: 988 call 1-800-273-TALK (toll free, 24 hour hotline)   Patient verbalizes understanding of instructions and care plan provided today and agrees to view in [place]. Active MyChart status confirmed with patient.    Telephone follow up appointment with care management team member scheduled for: The patient has been provided with contact information for the care management team and has been advised to call with any health related questions or concerns.   Thea Silversmith, RN, MSN, BSN, CCM Care Management Coordinator [facility] [phone]

## 2021-02-24 ENCOUNTER — Other Ambulatory Visit: Payer: Self-pay

## 2021-02-24 ENCOUNTER — Encounter: Payer: No Typology Code available for payment source | Attending: Surgery | Admitting: Skilled Nursing Facility1

## 2021-02-24 DIAGNOSIS — Z6841 Body Mass Index (BMI) 40.0 and over, adult: Secondary | ICD-10-CM | POA: Insufficient documentation

## 2021-02-24 DIAGNOSIS — E119 Type 2 diabetes mellitus without complications: Secondary | ICD-10-CM | POA: Diagnosis not present

## 2021-02-24 NOTE — Progress Notes (Signed)
Bariatric Nutrition Follow-Up Visit Medical Nutrition Therapy    NUTRITION ASSESSMENT   Surgery date: 12/30/2020 Surgery type: RYGB Start weight at [facility]: 294.3 Weight today: 246.1 pounds  Body Composition Scale 02/24/2021  Weight  lbs 246.1  Total Body Fat  % 45.7     Visceral Fat 17  Fat-Free Mass  % 54.2     Total Body Water  % 41.6     Muscle-Mass  lbs 30.7  BMI 43.2  Body Fat Displacement ---        Torso  lbs 69.8        Left Leg  lbs 13.9        Right Leg  lbs 13.9        Left Arm  lbs 6.9        Right Arm  lbs 6.9   Clinical  Medical hx: HTN, sleep apnea Medications: see list  Labs: A1C 6.9, Cholesterol 200, HDL 49, Tryglycerides 163, LDL 123, AST 44, ALT 55 Notable signs/symptoms: hip and back pain Any previous deficiencies? No   Lifestyle & Dietary Hx  Pt states she uses [employer] for the pool.  Pt states she works 2 jobs.  Pt sates she really does not feel hunger.   Estimated daily fluid intake: 80 oz Estimated daily protein intake: 60 g Supplements: multi and calcium Current average weekly physical activity: pool exercises and stationary  trying to do 5 days a week   24-Hr Dietary Recall First Meal: egg + cheese or yogurt Snack:  cheese stick Second Meal: cottage cheese Snack:   Third Meal: beef + cheese + beans or shrimp Snack: half protein shake Beverages: water  Post-Op Goals/ Signs/ Symptoms Using straws: no Drinking while eating: no Chewing/swallowing difficulties: no Changes in vision: no Changes to mood/headaches: no Hair loss/changes to skin/nails: no Difficulty focusing/concentrating: no Sweating: no Limb weakness: no Dizziness/lightheadedness: no Palpitations: no  Carbonated/caffeinated beverages: no N/V/D/C/Gas: no Abdominal pain: no Dumping syndrome: no    NUTRITION DIAGNOSIS  Overweight/obesity ([place]-3.3) related to past poor dietary habits and physical inactivity as evidenced by completed bariatric surgery and following  dietary guidelines for continued weight loss and healthy nutrition status.     NUTRITION INTERVENTION Nutrition counseling (C-1) and education (E-2) to facilitate bariatric surgery goals, including: Diet advancement to the next phase (phase 4) now including non starchy  The importance of consuming adequate calories as well as certain nutrients daily due to the body's need for essential vitamins, minerals, and fats The importance of daily physical activity and to reach a goal of at least 150 minutes of moderate to vigorous physical activity weekly (or as directed by their physician) due to benefits such as increased musculature and improved lab values The importance of intuitive eating specifically learning hunger-satiety cues and understanding the importance of learning a new body: The importance of mindful eating to avoid grazing behaviors  Importance of vegetables To have an overall healthy diet, adult men and women are recommended to consume anywhere from 2-3 cups of vegetables daily. Vegetables provide a wide range of vitamins and minerals such as vitamin A, vitamin C, potassium, and folic acid. According to the [employer], including fruit and vegetables daily may reduce the risk of cardiovascular disease, certain cancers, and other non-communicable diseases.  Goals: -Continue to aim for a minimum of 64 fluid ounces 7 days a week with at least 30 ounces being plain water  -Eat non-starchy vegetables 2 times a day 7 days a week  -  Start out with soft cooked vegetables today and tomorrow; if tolerated begin to eat raw vegetables or cooked including salads  -Eat your 3 ounces of protein first then start in on your non-starchy vegetables; once you understand how much of your meal leads to satisfaction and not full while still eating 3 ounces of protein and non-starchy vegetables you can eat them in any order   -Continue to aim for 30 minutes of activity at least 5 times a  week  -Do NOT cook with/add to your food: alfredo sauce, cheese sauce, barbeque sauce, ketchup, fat back, butter, bacon grease, grease, Crisco, OR SUGAR   Handouts Provided Include  Phase 4  Learning Style & Readiness for Change Teaching method utilized: Visual & Auditory  Demonstrated degree of understanding via: Teach Back  Readiness Level: contemplative  Barriers to learning/adherence to lifestyle change: none identified   RD's Notes for Next Visit Assess adherence to pt chosen goals    MONITORING & EVALUATION Dietary intake, weekly physical activity, body weight  Next Steps Patient is to follow-up in late April/early may

## 2021-03-18 ENCOUNTER — Telehealth: Payer: No Typology Code available for payment source

## 2021-03-30 ENCOUNTER — Other Ambulatory Visit: Payer: Self-pay

## 2021-03-31 ENCOUNTER — Other Ambulatory Visit (HOSPITAL_COMMUNITY): Payer: Self-pay

## 2021-04-01 ENCOUNTER — Other Ambulatory Visit: Payer: Self-pay

## 2021-04-01 ENCOUNTER — Other Ambulatory Visit (HOSPITAL_COMMUNITY): Payer: Self-pay

## 2021-04-01 ENCOUNTER — Ambulatory Visit (INDEPENDENT_AMBULATORY_CARE_PROVIDER_SITE_OTHER): Payer: No Typology Code available for payment source | Admitting: Physician Assistant

## 2021-04-01 ENCOUNTER — Encounter: Payer: Self-pay | Admitting: Medical-Surgical

## 2021-04-01 ENCOUNTER — Encounter: Payer: Self-pay | Admitting: Physician Assistant

## 2021-04-01 VITALS — BP 180/71 | HR 82 | Resp 18 | Ht 63.0 in | Wt 229.0 lb

## 2021-04-01 DIAGNOSIS — N3941 Urge incontinence: Secondary | ICD-10-CM | POA: Diagnosis not present

## 2021-04-01 DIAGNOSIS — R300 Dysuria: Secondary | ICD-10-CM

## 2021-04-01 DIAGNOSIS — R102 Pelvic and perineal pain: Secondary | ICD-10-CM | POA: Diagnosis not present

## 2021-04-01 DIAGNOSIS — N898 Other specified noninflammatory disorders of vagina: Secondary | ICD-10-CM

## 2021-04-01 LAB — POCT URINALYSIS DIP (CLINITEK)
Bilirubin, UA: NEGATIVE
Blood, UA: NEGATIVE
Glucose, UA: NEGATIVE mg/dL
Ketones, POC UA: NEGATIVE mg/dL
Nitrite, UA: NEGATIVE
POC PROTEIN,UA: NEGATIVE
Spec Grav, UA: 1.01 (ref 1.010–1.025)
Urobilinogen, UA: 0.2 E.U./dL
pH, UA: 6 (ref 5.0–8.0)

## 2021-04-01 MED ORDER — PHENAZOPYRIDINE HCL 200 MG PO TABS: 200.0000 mg | ORAL_TABLET | Freq: Three times a day (TID) | ORAL | 0 refills | Status: AC

## 2021-04-01 MED ORDER — PANTOPRAZOLE SODIUM 40 MG PO TBEC
DELAYED_RELEASE_TABLET | ORAL | 0 refills | Status: DC
  Filled 2021-04-01: qty 90, 90d supply, fill #0

## 2021-04-01 NOTE — Patient Instructions (Signed)
Dysuria ?Dysuria is pain or discomfort during urination. The pain or discomfort may be felt in the part of the body that drains urine from the bladder (urethra) or in the surrounding tissue of the genitals. The pain may also be felt in the groin area, lower abdomen, or lower back. ?You may have to urinate frequently or have the sudden feeling that you have to urinate (urgency). Dysuria can affect anyone, but it is more common in females. Dysuria can be caused by many different things, including: ?Urinary tract infection. ?Kidney stones or bladder stones. ?Certain STIs (sexually transmitted infections), such as chlamydia. ?Dehydration. ?Inflammation of the tissues of the vagina. ?Use of certain medicines. ?Use of certain soaps or scented products that cause irritation. ?Follow these instructions at home: ?Medicines ?Take over-the-counter and prescription medicines only as told by your health care provider. ?If you were prescribed an antibiotic medicine, take it as told by your health care provider. Do not stop taking the antibiotic even if you start to feel better. ?Eating and drinking ? ?Drink enough fluid to keep your urine pale yellow. ?Avoid caffeinated beverages, tea, and alcohol. These beverages can irritate the bladder and make dysuria worse. In males, alcohol may irritate the prostate. ?General instructions ?Watch your condition for any changes. ?Urinate often. Avoid holding urine for long periods of time. ?If you are female, you should wipe from front to back after urinating or having a bowel movement. Use each piece of toilet paper only once. ?Empty your bladder after sex. ?Keep all follow-up visits. This is important. ?If you had any tests done to find the cause of dysuria, it is up to you to get your test results. Ask your health care provider, or the department that is doing the test, when your results will be ready. ?Contact a health care provider if: ?You have a fever. ?You develop pain in your back or  sides. ?You have nausea or vomiting. ?You have blood in your urine. ?You are not urinating as often as you usually do. ?Get help right away if: ?Your pain is severe and not relieved with medicines. ?You cannot eat or drink without vomiting. ?You are confused. ?You have a rapid heartbeat while resting. ?You have shaking or chills. ?You feel extremely weak. ?Summary ?Dysuria is pain or discomfort while urinating. Many different conditions can lead to dysuria. ?If you have dysuria, you may have to urinate frequently or have the sudden feeling that you have to urinate (urgency). ?Watch your condition for any changes. Keep all follow-up visits. ?Make sure that you urinate often and drink enough fluid to keep your urine pale yellow. ?This information is not intended to replace advice given to you by your health care provider. Make sure you discuss any questions you have with your health care provider. ?Document Revised: 08/24/2019 Document Reviewed: 08/24/2019 ?Elsevier Patient Education ? [place]. ? ?

## 2021-04-01 NOTE — Progress Notes (Signed)
? ?Subjective:  ? ? Patient ID: Olivia Hodges, female    DOB: 1975/03/25, 46 y.o.   MRN: [id number] ? ?HPI ?Pt is a 46 yo obese female with T2DM, HTN who presents to the clinic with dysuria, urge incontience, frequent urination, vaginal irritation for the last [place] No fever, chills, body aches, flank pain, nausea, headaches. She has some suprapubic pressure. No vaginal discharge or odor. No recent hx of UTI. Denies any diarrhea or constipation. Not tried anything to make better. No medication changes.  ? ?.. ?Active Ambulatory Problems  ?  Diagnosis Date Noted  ? Essential (primary) hypertension 12/29/2018  ? OSA on CPAP 06/29/2019  ? Primary osteoarthritis of left hip 07/01/2020  ? Radiculopathy, lumbar region 08/02/2019  ? Anterolisthesis of lumbar spine 07/01/2020  ? Tear of left acetabular labrum 07/01/2020  ? Degenerative disc disease, cervical 07/01/2020  ? Controlled type 2 diabetes mellitus without complication, without long-term current use of insulin ([place]) 11/18/2020  ? Obesity 11/18/2020  ? Body mass index (BMI) 50.0-59.9, adult ([place]) 08/02/2019  ? Elevated blood-pressure reading, without diagnosis of hypertension 08/29/2019  ? Neck pain 08/02/2019  ? ?Resolved Ambulatory Problems  ?  Diagnosis Date Noted  ? No Resolved Ambulatory Problems  ? ?Past Medical History:  ?Diagnosis Date  ? Degenerative disc disease, lumbar, cervical   ? Diabetes mellitus without complication ([place])   ? High blood pressure   ? Osteoarthritis   ? Sleep apnea   ? Urticaria   ? ? ? ? ? ?Review of Systems ?See HPI.  ?   ?Objective:  ? Physical Exam ?Vitals reviewed.  ?Constitutional:   ?   Appearance: Normal appearance. She is obese.  ?HENT:  ?   Head: Normocephalic.  ?Cardiovascular:  ?   Rate and Rhythm: Normal rate and regular rhythm.  ?Pulmonary:  ?   Effort: Pulmonary effort is normal.  ?Abdominal:  ?   General: There is no distension.  ?   Palpations: Abdomen is soft. There is no mass.  ?    Tenderness: There is no abdominal tenderness. There is no right CVA tenderness, left CVA tenderness, guarding or rebound.  ?   Hernia: No hernia is present.  ?Neurological:  ?   General: No focal deficit present.  ?   Mental Status: She is alert and oriented to person, place, and time.  ?Psychiatric:     ?   Mood and Affect: Mood normal.  ? ? ? ?.. ?Results for orders placed or performed in visit on 04/01/21  ?POCT URINALYSIS DIP (CLINITEK)  ?Result Value Ref Range  ? Color, UA yellow yellow  ? Clarity, UA clear clear  ? Glucose, UA negative negative mg/dL  ? Bilirubin, UA negative negative  ? Ketones, POC UA negative negative mg/dL  ? Spec Grav, UA 1.010 1.010 - 1.025  ? Blood, UA negative negative  ? pH, UA 6.0 5.0 - 8.0  ? POC PROTEIN,UA negative negative, trace  ? Urobilinogen, UA 0.2 0.2 or 1.0 E.U./dL  ? Nitrite, UA Negative Negative  ? Leukocytes, UA Small (1+) (A) Negative  ? ? ? ?   ?Assessment & Plan:  ?..Argie was seen today for urinary tract infection. ? ?Diagnoses and all orders for this visit: ? ?Dysuria ?-     POCT URINALYSIS DIP (CLINITEK) ?-     Urine Culture ?-     phenazopyridine (PYRIDIUM) 200 MG tablet; Take 1 tablet (200 mg total) by mouth 3 (three)  times daily for 2 days. ? ?Urge incontinence ?-     phenazopyridine (PYRIDIUM) 200 MG tablet; Take 1 tablet (200 mg total) by mouth 3 (three) times daily for 2 days. ? ?Vaginal irritation ?-     phenazopyridine (PYRIDIUM) 200 MG tablet; Take 1 tablet (200 mg total) by mouth 3 (three) times daily for 2 days. ?-     WET PREP FOR [place], YEAST, CLUE ? ?Suprapubic pressure ?-     phenazopyridine (PYRIDIUM) 200 MG tablet; Take 1 tablet (200 mg total) by mouth 3 (three) times daily for 2 days. ?-     WET PREP FOR TRICH, YEAST, CLUE ? ? ?UA positive for leuks only.  ?Will culture ?Will get wet prep as well looking for yeast of BV ?Pyridium sent to pharmacy ?No red flag signs or symptoms ?Symptomatic care discussed  ?Follow up as needed or if symptoms  change or worsen ?

## 2021-04-02 ENCOUNTER — Other Ambulatory Visit (HOSPITAL_COMMUNITY): Payer: Self-pay

## 2021-04-02 LAB — WET PREP FOR TRICH, YEAST, CLUE
MICRO NUMBER:: 13107655
Specimen Quality: ADEQUATE

## 2021-04-02 MED ORDER — PANTOPRAZOLE SODIUM 40 MG PO TBEC
DELAYED_RELEASE_TABLET | ORAL | 11 refills | Status: DC
  Filled 2021-04-02 – 2021-06-29 (×2): qty 30, 30d supply, fill #0
  Filled 2021-07-24: qty 30, 30d supply, fill #1

## 2021-04-02 NOTE — Progress Notes (Signed)
No BV or yeast seen on wet prep.  ?Still waiting for urine culture.

## 2021-04-03 LAB — URINE CULTURE
MICRO NUMBER:: 13107683
SPECIMEN QUALITY:: ADEQUATE

## 2021-04-03 NOTE — Progress Notes (Signed)
HI [place],  ?Your urine culture came back negative just shows skin bacteria.

## 2021-04-30 ENCOUNTER — Telehealth: Payer: Self-pay | Admitting: *Deleted

## 2021-04-30 ENCOUNTER — Other Ambulatory Visit (HOSPITAL_COMMUNITY): Payer: Self-pay

## 2021-04-30 NOTE — Chronic Care Management (AMB) (Signed)
?  Care Management  ? ?Note ? ?04/30/2021 ?Name: [name] MRN: [id number] DOB: 1975-06-24 ? ?Olivia Hodges is a 46 y.o. year old female who is a primary care patient of Samuel Bouche, NP and is actively engaged with the care management team. I reached out to [name] by phone today to assist with re-scheduling a follow up visit with the RN Case Manager ? ?Follow up plan: ?Patient declines further follow up and engagement by the care management team. Appropriate care team members and provider have been notified via electronic communication.  ? ?Dion Parrow, CCMA ?Care Guide, Embedded Care Coordination ?[place]  Care Management  ?Direct Dial: [phone] ? ? ?

## 2021-05-08 ENCOUNTER — Ambulatory Visit: Payer: Self-pay

## 2021-05-08 NOTE — Chronic Care Management (AMB) (Signed)
? ?  05/08/2021 ? ?Yonna Scarfone ?December 09, 1975 ?[id number] ? ? ? ?Care Management  ? ?Follow Up Note ? ? ?05/08/2021 ?Name: Doranne Schmutz MRN: [id number] DOB: 1975/06/10 ? ? ?Referred by: Samuel Bouche, NP ?Reason for referral : No chief complaint on file. ? ? ?Care Guide called to reach patient to re-schedule follow up telephone call. RNCM received message from [place] that patient declines further participation/follow up, patient reports doing fine and does not need further follow up at this time. ? ?Follow Up Plan:  No follow up indicated ? ?Thea Silversmith, RN, MSN, BSN, CCM ?Care Management Coordinator ?[facility] ?[phone]  ?

## 2021-05-08 NOTE — Patient Instructions (Signed)
Visit Information ? ?Thank you for allowing me to share the care management and care coordination services that are available to you as part of your health plan and services through your primary care provider and medical home. Please reach out to me at [phone] if the care management/care coordination team may be of assistance to you in the future.  ? ?Thea Silversmith, RN, MSN, BSN, CCM ?Care Management Coordinator ?[facility] ?[phone]  ?

## 2021-05-20 ENCOUNTER — Ambulatory Visit: Payer: No Typology Code available for payment source | Admitting: Skilled Nursing Facility1

## 2021-05-20 ENCOUNTER — Encounter: Payer: No Typology Code available for payment source | Attending: Surgery | Admitting: Skilled Nursing Facility1

## 2021-05-20 DIAGNOSIS — Z6841 Body Mass Index (BMI) 40.0 and over, adult: Secondary | ICD-10-CM | POA: Insufficient documentation

## 2021-05-20 NOTE — Progress Notes (Signed)
Bariatric Nutrition Follow-Up Visit ?Medical Nutrition Therapy  ? ? ?NUTRITION ASSESSMENT ?  ?Surgery date: 12/30/2020 ?Surgery type: RYGB ?Start weight at [facility]: 294.3 ?Weight today: virtual appt: pt identified by name and DOB, pt agreeable to the limitations of this visit type ? ?Body Composition Scale 02/24/2021  ?Weight  lbs 246.1  ?Total Body Fat  % 45.7  ?   Visceral Fat 17  ?Fat-Free Mass  % 54.2  ?   Total Body Water  % 41.6  ?   Muscle-Mass  lbs 30.7  ?BMI 43.2  ?Body Fat Displacement ---  ?      Torso  lbs 69.8  ?      Left Leg  lbs 13.9  ?      Right Leg  lbs 13.9  ?      Left Arm  lbs 6.9  ?      Right Arm  lbs 6.9  ? ?Clinical  ?Medical hx: HTN, sleep apnea ?Medications: see list  ?Labs: A1C 6.9, Cholesterol 200, HDL 49, Tryglycerides 163, LDL 123, AST 44, ALT 55 ?Notable signs/symptoms: hip and back pain weekly 2-3 headches a week ?Any previous deficiencies? No ?  ?Lifestyle & Dietary Hx ? ?Pt states she [facility].  ?Pt states she works 2 jobs.  ? ?Pt states she takes a muscle relaxer and tylenol for her headaches stating the are linked to neck pain and she has had them since before surgery.  ?Pt states about 1 week prior to her menstrual cycle she will get really tired.  ?Pt states she is getting labs drawn with her PCP upcoming.  ?Pt states she stays motivated with her weight loss and being so close to 100 pounds.  ? ?Estimated daily fluid intake: 100 oz ?Estimated daily protein intake: 60 g ?Supplements: multi and calcium ?Current average weekly physical activity: Pool 1-2 times doing laps for 30-40 minutes a week and stationary bike 1 time a week 30 minutes some arm machines ? ?24-Hr Dietary Recall ?First Meal: yogurt or cottage cheese sometimes 2 eggs and cheese ?Snack:   ?Second Meal: chicken or egg salad + green beans or salad or cucumbers or peppers ?Snack:  sometimes cucumber and low fat cream cheese (rare) ?Third Meal: chicken or pork tenderloin or lean ground beef +  brussles or zucchini or asparagus ?Snack: ?Beverages: water, coffee on the weekends ? ?Post-Op Goals/ Signs/ Symptoms ?Using straws: no ?Drinking while eating: no ?Chewing/swallowing difficulties: no ?Changes in vision: no ?Changes to mood/headaches: no ?Hair loss/changes to skin/nails: no ?Difficulty focusing/concentrating: no ?Sweating: no ?Limb weakness: no ?Dizziness/lightheadedness: no ?Palpitations: no  ?Carbonated/caffeinated beverages: no ?N/V/D/C/Gas: no; ?Abdominal pain: no ?Dumping syndrome: no ? ?  ?NUTRITION DIAGNOSIS  ?Overweight/obesity ([place]-3.3) related to past poor dietary habits and physical inactivity as evidenced by completed bariatric surgery and following dietary guidelines for continued weight loss and healthy nutrition status. ?  ?  ?NUTRITION INTERVENTION ?Nutrition counseling (C-1) and education (E-2) to facilitate bariatric surgery goals, including: ?Diet advancement to the next phase (phase 5) now including starchy  ?The importance of consuming adequate calories as well as certain nutrients daily due to the body's need for essential vitamins, minerals, and fats ?The importance of daily physical activity and to reach a goal of at least 150 minutes of moderate to vigorous physical activity weekly (or as directed by their physician) due to benefits such as increased musculature and improved lab values ?The importance of intuitive eating specifically learning hunger-satiety cues  and understanding the importance of learning a new body: The importance of mindful eating to avoid grazing behaviors  ?Importance of vegetables ?To have an overall healthy diet, adult men and women are recommended to consume anywhere from 2-3 cups of vegetables daily. Vegetables provide a wide range of vitamins and minerals such as vitamin A, vitamin C, potassium, and folic acid. According to the [employer], including fruit and vegetables daily may reduce the risk of cardiovascular disease, certain  cancers, and other non-communicable diseases. ?Why you need complex carbohydrates: Whole grains and other complex carbohydrates are required to have a healthy diet. Whole grains provide fiber which can help with blood glucose levels and help keep you satiated. Fruits and starchy vegetables provide essential vitamins and minerals required for immune function, eyesight support, brain support, bone density, wound healing and many other functions within the body. According to the current evidenced based 2020-2025 Dietary Guidelines for Americans, complex carbohydrates are part of a healthy eating pattern which is associated with a decreased risk for type 2 diabetes, cancers, and cardiovascular disease.  ? ? ?Handouts Provided Include  ?Phase 5 ? ?Learning Style & Readiness for Change ?Teaching method utilized: Visual & Auditory  ?Demonstrated degree of understanding via: Teach Back  ?Readiness Level: contemplative  ?Barriers to learning/adherence to lifestyle change: none identified  ? ?RD's Notes for Next Visit ?Assess adherence to pt chosen goals  ? ? ?MONITORING & EVALUATION ?Dietary intake, weekly physical activity, body weight ? ?Next Steps ?Patient is to follow-up in late April/early may ?

## 2021-05-28 ENCOUNTER — Encounter: Payer: Self-pay | Admitting: Medical-Surgical

## 2021-05-28 ENCOUNTER — Ambulatory Visit (INDEPENDENT_AMBULATORY_CARE_PROVIDER_SITE_OTHER): Payer: No Typology Code available for payment source | Admitting: Medical-Surgical

## 2021-05-28 ENCOUNTER — Other Ambulatory Visit (HOSPITAL_COMMUNITY): Payer: Self-pay

## 2021-05-28 VITALS — BP 126/73 | HR 74 | Resp 20 | Ht 63.0 in | Wt 205.0 lb

## 2021-05-28 DIAGNOSIS — Z9884 Bariatric surgery status: Secondary | ICD-10-CM | POA: Diagnosis not present

## 2021-05-28 DIAGNOSIS — E119 Type 2 diabetes mellitus without complications: Secondary | ICD-10-CM

## 2021-05-28 DIAGNOSIS — N3941 Urge incontinence: Secondary | ICD-10-CM

## 2021-05-28 DIAGNOSIS — I10 Essential (primary) hypertension: Secondary | ICD-10-CM

## 2021-05-28 MED ORDER — OXYBUTYNIN CHLORIDE 5 MG PO TABS
5.0000 mg | ORAL_TABLET | Freq: Three times a day (TID) | ORAL | 0 refills | Status: DC | PRN
  Filled 2021-05-28: qty 90, 30d supply, fill #0

## 2021-05-28 NOTE — Progress Notes (Signed)
?HPI with pertinent ROS:  ? ?CC: 3-6 month follow up ? ?HPI: ?Pleasant 46 year old female presenting today for the following: ? ?HTN ?Medication: Losartan 120m, HCTZ 238mprn ?Compliant: Yes ?Side effects: None ?Checking BP at home: yes, readings at goal ?Low sodium diet: yes ?Exercise: swimming ?Concerning symptoms: None ? ?Allergies ?Taking Xyzal and Singulair as prescribed.  Feels that is doing a little for her allergies but she is still struggling a bit.  She is trying to wean herself off of these medications but is taking it very slowly. ? ?Urinary issues ?Having issues with urge incontinence and notes that there are times when she ends up having to change her close due to inability to get to the bathroom in time.  Has not had any issues at bedtime but finds herself wearing.  Panties as well as pantiliners and incontinence pads throughout the day in order to prevent accidents.  No other urinary issues.  This has been going on for several months but has gotten worse in the past couple of weeks. ? ?Diabetes ?Not currently taking any medication.  She is rapidly losing weight after gastric bypass and has had significant dietary changes related to the surgery.  Is due for recheck of her A1c ? ?I reviewed the past medical history, family history, social history, surgical history, and allergies today and no changes were needed.  Please see the problem list section below in epic for further details. ? ?Physical exam:  ? ?General: Well Developed, well nourished, and in no acute distress.  ?Neuro: Alert and oriented x3.  ?HEENT: Normocephalic, atraumatic.  ?Skin: Warm and dry. ?Cardiac: Regular rate and rhythm, no murmurs rubs or gallops, no lower extremity edema.  ?Respiratory: Clear to auscultation bilaterally. Not using accessory muscles, speaking in full sentences. ? ?Impression and Recommendations:   ? ?1. Controlled type 2 diabetes mellitus without complication, without long-term current use of insulin  ([place]?Checking hemoglobin A1c and CMP today.  Continue weight loss efforts and monitoring dietary intake of concentrated sweets and simple carbohydrates. ?- Hemoglobin A1c ?- COMPLETE METABOLIC PANEL WITH GFR ? ?2. Essential (primary) hypertension ?Checking CBC with differential and lipid panel today.  Blood pressure well controlled.  Continue losartan 100 mg daily and hydrochlorothiazide 25 mg daily as needed. ?- CBC with Differential/Platelet ?- Lipid panel ? ?3. History of gastric bypass ?Checking labs as below.  She has an upcoming appointment in around a month with her gastric bypass surgeon and since she is having labs drawn today, we will go ahead and draw standard gastric bypass labs. ?- CBC with Differential/Platelet ?- Copper, serum ?- Ferritin ?- Folate ?- Lipid panel ?- Zinc ?- Vitamin B1 ?- Vitamin B12 ?- VITAMIN D 25 Hydroxy (Vit-D Deficiency, Fractures) ?- Parathyroid hormone, intact (no Ca) ?- TSH ? ?4. Urge incontinence ?Symptoms classic for urge incontinence.  Unfortunately, her situation is complicated by recent gastric bypass.  Advised her that I would typically recommend starting Ditropan 5 mg extended release once daily or 5 mg up to 3 times daily as needed of the instant release.  I have sent the instant release to the pharmacy for her as I am unsure how the extended release will do with a gastric bypass but would like her to reach out to her surgeon's office to get clearance to use this medication since it does have effects on the GI system that may be contraindicated.  Patient verbalized understanding and is agreeable to the plan. ? ?Return in about 6 months (around  11/28/2021) for chronic disease follow up. ?___________________________________________ ?Clearnce Sorrel, DNP, APRN, FNP-BC ?Primary Care and Sports Medicine ?[place] ?

## 2021-06-04 LAB — CBC WITH DIFFERENTIAL/PLATELET
Absolute Monocytes: 495 cells/uL (ref 200–950)
Basophils Absolute: 23 cells/uL (ref 0–200)
Basophils Relative: 0.3 %
Eosinophils Absolute: 38 cells/uL (ref 15–500)
Eosinophils Relative: 0.5 %
HCT: 41 % (ref 35.0–45.0)
Hemoglobin: 13.9 g/dL (ref 11.7–15.5)
Lymphs Abs: 1718 cells/uL (ref 850–3900)
MCH: 32.1 pg (ref 27.0–33.0)
MCHC: 33.9 g/dL (ref 32.0–36.0)
MCV: 94.7 fL (ref 80.0–100.0)
MPV: 11.1 fL (ref 7.5–12.5)
Monocytes Relative: 6.6 %
Neutro Abs: 5228 cells/uL (ref 1500–7800)
Neutrophils Relative %: 69.7 %
Platelets: 308 10*3/uL (ref 140–400)
RBC: 4.33 10*6/uL (ref 3.80–5.10)
RDW: 12.9 % (ref 11.0–15.0)
Total Lymphocyte: 22.9 %
WBC: 7.5 10*3/uL (ref 3.8–10.8)

## 2021-06-04 LAB — COMPLETE METABOLIC PANEL WITH GFR
AG Ratio: 1.5 (calc) (ref 1.0–2.5)
ALT: 21 U/L (ref 6–29)
AST: 23 U/L (ref 10–35)
Albumin: 4.5 g/dL (ref 3.6–5.1)
Alkaline phosphatase (APISO): 59 U/L (ref 31–125)
BUN: 11 mg/dL (ref 7–25)
CO2: 28 mmol/L (ref 20–32)
Calcium: 10.3 mg/dL — ABNORMAL HIGH (ref 8.6–10.2)
Chloride: 101 mmol/L (ref 98–110)
Creat: 0.6 mg/dL (ref 0.50–0.99)
Globulin: 3 g/dL (calc) (ref 1.9–3.7)
Glucose, Bld: 87 mg/dL (ref 65–139)
Potassium: 4.3 mmol/L (ref 3.5–5.3)
Sodium: 139 mmol/L (ref 135–146)
Total Bilirubin: 0.5 mg/dL (ref 0.2–1.2)
Total Protein: 7.5 g/dL (ref 6.1–8.1)
eGFR: 111 mL/min/{1.73_m2} (ref >=60)

## 2021-06-04 LAB — HEMOGLOBIN A1C
Hgb A1c MFr Bld: 5.3 % of total Hgb (ref <5.7)
Mean Plasma Glucose: 105 mg/dL
eAG (mmol/L): 5.8 mmol/L

## 2021-06-04 LAB — LIPID PANEL
Cholesterol: 163 mg/dL (ref <200)
HDL: 50 mg/dL (ref >=50)
LDL Cholesterol (Calc): 90 mg/dL (calc)
Non-HDL Cholesterol (Calc): 113 mg/dL (calc) (ref <130)
Total CHOL/HDL Ratio: 3.3 (calc) (ref <5.0)
Triglycerides: 133 mg/dL (ref <150)

## 2021-06-04 LAB — PARATHYROID HORMONE, INTACT (NO CA): PTH: 27 pg/mL (ref 16–77)

## 2021-06-04 LAB — FOLATE: Folate: 17.2 ng/mL

## 2021-06-04 LAB — TSH: TSH: 1.21 mIU/L

## 2021-06-04 LAB — ZINC: Zinc: 91 ug/dL (ref 60–130)

## 2021-06-04 LAB — COPPER, SERUM: Copper: 142 ug/dL (ref 70–175)

## 2021-06-04 LAB — FERRITIN: Ferritin: 109 ng/mL (ref 16–232)

## 2021-06-04 LAB — VITAMIN D 25 HYDROXY (VIT D DEFICIENCY, FRACTURES): Vit D, 25-Hydroxy: 49 ng/mL (ref 30–100)

## 2021-06-04 LAB — VITAMIN B1: Vitamin B1 (Thiamine): 28 nmol/L (ref 8–30)

## 2021-06-29 ENCOUNTER — Other Ambulatory Visit (HOSPITAL_COMMUNITY): Payer: Self-pay

## 2021-07-01 ENCOUNTER — Other Ambulatory Visit (HOSPITAL_COMMUNITY): Payer: Self-pay

## 2021-07-13 ENCOUNTER — Encounter: Payer: Self-pay | Admitting: Skilled Nursing Facility1

## 2021-07-13 ENCOUNTER — Encounter: Payer: No Typology Code available for payment source | Attending: Surgery | Admitting: Skilled Nursing Facility1

## 2021-07-13 DIAGNOSIS — Z6841 Body Mass Index (BMI) 40.0 and over, adult: Secondary | ICD-10-CM | POA: Insufficient documentation

## 2021-07-13 NOTE — Progress Notes (Signed)
Bariatric Nutrition Follow-Up Visit Medical Nutrition Therapy    NUTRITION ASSESSMENT   Surgery date: 12/30/2020 Surgery type: RYGB Start weight at [facility]: 294.3 Weight today: 189.6 pounds  Body Composition Scale 02/24/2021 07/13/2021  Weight  lbs 246.1 189.6  Total Body Fat  % 45.7 38.7     Visceral Fat 17 11  Fat-Free Mass  % 54.2 61.2     Total Body Water  % 41.6 45.1     Muscle-Mass  lbs 30.7 30.2  BMI 43.2 33.2  Body Fat Displacement ---         Torso  lbs 69.8 45.4        Left Leg  lbs 13.9 9.0        Right Leg  lbs 13.9 9.0        Left Arm  lbs 6.9 4.5        Right Arm  lbs 6.9 4.5   Clinical  Medical hx: HTN, sleep apnea Medications: see list  Labs: A1C 5.3,  Notable signs/symptoms: hip and back pain weekly 2-3 headches a week (has subsided)  Any previous deficiencies? No   Lifestyle & Dietary Hx  Pt states she uses [facility] for [facility].  Pt states she works 2 jobs.   Pt states she weaned herself off her blood pressures medications doctor is involved.  Pt states she drinks water before eating anything.  Pt states she is not really a big sweet person she is more of a salty snacker.  Pt states she has a weight goal to 170 pounds but is okay with going lower.   Estimated daily fluid intake: 80-100 oz Estimated daily protein intake: 60 g Supplements: multi and calcium Current average weekly physical activity: stationary bike or swimming and resistance 2 times a week (inconsistent)   24-Hr Dietary Recall: eating 3 meals in a day 6 days a week and at least 1 snack 3 days a week  First Meal: yogurt and coffee Snack:   Second Meal: chicken chili Snack:  sometimes cucumber and low fat cream cheese (rare) Third Meal: meatloaf and asparagus  Snack 7:30pm: lunch meat and cheese  Beverages: water, coffee on the weekends  Post-Op Goals/ Signs/ Symptoms Using straws: no Drinking while eating: no Chewing/swallowing difficulties: no Changes in vision: no Changes  to mood/headaches: no Hair loss/changes to skin/nails: no Difficulty focusing/concentrating: no Sweating: no Limb weakness: no Dizziness/lightheadedness: no Palpitations: no  Carbonated/caffeinated beverages: no N/V/D/C/Gas: some constipation  Abdominal pain: no Dumping syndrome: no    NUTRITION DIAGNOSIS  Overweight/obesity ([place]-3.3) related to past poor dietary habits and physical inactivity as evidenced by completed bariatric surgery and following dietary guidelines for continued weight loss and healthy nutrition status.     NUTRITION INTERVENTION Nutrition counseling (C-1) and education (E-2) to facilitate bariatric surgery goals, including: The importance of consuming adequate calories as well as certain nutrients daily due to the body's need for essential vitamins, minerals, and fats The importance of daily physical activity and to reach a goal of at least 150 minutes of moderate to vigorous physical activity weekly (or as directed by their physician) due to benefits such as increased musculature and improved lab values The importance of intuitive eating specifically learning hunger-satiety cues and understanding the importance of learning a new body: The importance of mindful eating to avoid grazing behaviors  Importance of vegetables To have an overall healthy diet, adult men and women are recommended to consume anywhere from 2-3 cups of vegetables daily. Vegetables provide  a wide range of vitamins and minerals such as vitamin A, vitamin C, potassium, and folic acid. According to the [employer], including fruit and vegetables daily may reduce the risk of cardiovascular disease, certain cancers, and other non-communicable diseases. Why you need complex carbohydrates: Whole grains and other complex carbohydrates are required to have a healthy diet. Whole grains provide fiber which can help with blood glucose levels and help keep you satiated. Fruits and starchy vegetables  provide essential vitamins and minerals required for immune function, eyesight support, brain support, bone density, wound healing and many other functions within the body. According to the current evidenced based 2020-2025 Dietary Guidelines for Americans, complex carbohydrates are part of a healthy eating pattern which is associated with a decreased risk for type 2 diabetes, cancers, and cardiovascular disease.  Encouraged patient to honor their body's internal hunger and fullness cues.  Throughout the day, check in mentally and rate hunger. Stop eating when satisfied not full regardless of how much food is left on the plate.  Get more if still hungry 20-30 minutes later.  The key is to honor satisfaction so throughout the meal, rate fullness factor and stop when comfortably satisfied not physically full. The key is to honor hunger and fullness without any feelings of guilt or shame.  Pay attention to what the internal cues are, rather than any external factors. This will enhance the confidence you have in listening to your own body and following those internal cues enabling you to increase how often you eat when you are hungry not out of appetite and stop when you are satisfied not full.  Encouraged pt to continue to eat balanced meals inclusive of non starchy vegetables 2 times a day 7 days a week Encouraged pt to choose lean protein sources: limiting beef, pork, sausage, hotdogs, and lunch meat Encourage pt to choose healthy fats such as plant based limiting animal fats Encouraged pt to continue to drink a minium 64 fluid ounces with half being plain water to satisfy proper hydration    Handouts Provided Include  Phase 7  Learning Style & Readiness for Change Teaching method utilized: Visual & Auditory  Demonstrated degree of understanding via: Teach Back  Readiness Level: action Barriers to learning/adherence to lifestyle change: none identified   RD's Notes for Next Visit Assess adherence to  pt chosen goals    MONITORING & EVALUATION Dietary intake, weekly physical activity, body weight  Next Steps Patient is to follow-up in December

## 2021-07-22 ENCOUNTER — Other Ambulatory Visit (HOSPITAL_COMMUNITY): Payer: Self-pay

## 2021-07-24 ENCOUNTER — Other Ambulatory Visit (HOSPITAL_COMMUNITY): Payer: Self-pay

## 2021-08-07 ENCOUNTER — Other Ambulatory Visit (HOSPITAL_COMMUNITY): Payer: Self-pay

## 2021-08-10 LAB — COLOGUARD: COLOGUARD: NEGATIVE

## 2021-08-24 ENCOUNTER — Other Ambulatory Visit (HOSPITAL_COMMUNITY): Payer: Self-pay

## 2021-08-24 ENCOUNTER — Encounter: Payer: Self-pay | Admitting: Medical-Surgical

## 2021-08-24 MED ORDER — PANTOPRAZOLE SODIUM 40 MG PO TBEC
DELAYED_RELEASE_TABLET | ORAL | 1 refills | Status: DC
Start: 2021-08-24 — End: 2021-11-30
  Filled 2021-08-24: qty 90, 90d supply, fill #0
  Filled 2021-11-21: qty 90, 90d supply, fill #1

## 2021-08-24 MED ORDER — OXYBUTYNIN CHLORIDE ER 10 MG PO TB24
10.0000 mg | ORAL_TABLET | Freq: Every day | ORAL | 1 refills | Status: DC
Start: 2021-08-24 — End: 2022-02-22
  Filled 2021-08-24: qty 90, 90d supply, fill #0
  Filled 2021-11-15: qty 90, 90d supply, fill #1

## 2021-08-24 MED ORDER — OXYBUTYNIN CHLORIDE ER 10 MG PO TB24
10.0000 mg | ORAL_TABLET | Freq: Every day | ORAL | 1 refills | Status: DC
Start: 2021-08-24 — End: 2021-08-24
  Filled 2021-08-24: qty 30, 30d supply, fill #0

## 2021-09-21 ENCOUNTER — Other Ambulatory Visit: Payer: Self-pay | Admitting: Osteopathic Medicine

## 2021-10-03 ENCOUNTER — Encounter: Payer: Self-pay | Admitting: Medical-Surgical

## 2021-10-05 ENCOUNTER — Other Ambulatory Visit (HOSPITAL_COMMUNITY): Payer: Self-pay

## 2021-10-05 MED ORDER — LEVOCETIRIZINE DIHYDROCHLORIDE 5 MG PO TABS
5.0000 mg | ORAL_TABLET | Freq: Every evening | ORAL | 3 refills | Status: DC
  Filled 2021-10-05: qty 90, 90d supply, fill #0
  Filled 2021-12-29: qty 90, 90d supply, fill #1
  Filled 2022-03-29: qty 90, 90d supply, fill #2
  Filled 2022-07-08: qty 90, 90d supply, fill #3

## 2021-10-05 MED ORDER — MONTELUKAST SODIUM 10 MG PO TABS
ORAL_TABLET | Freq: Every day | ORAL | 3 refills | Status: DC
Start: 2021-10-05 — End: 2022-09-29
  Filled 2021-10-05: qty 90, 90d supply, fill #0
  Filled 2021-12-29: qty 90, 90d supply, fill #1
  Filled 2022-03-29: qty 90, 90d supply, fill #2
  Filled 2022-07-08: qty 90, 90d supply, fill #3

## 2021-10-05 MED ORDER — TIZANIDINE HCL 4 MG PO TABS
4.0000 mg | ORAL_TABLET | Freq: Every day | ORAL | 1 refills | Status: DC
  Filled 2021-10-05: qty 90, 90d supply, fill #0
  Filled 2021-12-29: qty 90, 90d supply, fill #1

## 2021-10-12 ENCOUNTER — Encounter: Payer: Self-pay | Admitting: Medical-Surgical

## 2021-10-20 ENCOUNTER — Other Ambulatory Visit: Payer: Self-pay | Admitting: Osteopathic Medicine

## 2021-10-27 ENCOUNTER — Other Ambulatory Visit (HOSPITAL_COMMUNITY): Payer: Self-pay

## 2021-10-27 MED ORDER — HYDROCHLOROTHIAZIDE 25 MG PO TABS
ORAL_TABLET | Freq: Every day | ORAL | 0 refills | Status: DC
  Filled 2021-10-27: qty 90, 90d supply, fill #0

## 2021-11-16 ENCOUNTER — Other Ambulatory Visit (HOSPITAL_COMMUNITY): Payer: Self-pay

## 2021-11-21 ENCOUNTER — Other Ambulatory Visit (HOSPITAL_COMMUNITY): Payer: Self-pay

## 2021-11-30 ENCOUNTER — Encounter: Payer: Self-pay | Admitting: Medical-Surgical

## 2021-11-30 ENCOUNTER — Other Ambulatory Visit (HOSPITAL_COMMUNITY): Payer: Self-pay

## 2021-11-30 ENCOUNTER — Ambulatory Visit (INDEPENDENT_AMBULATORY_CARE_PROVIDER_SITE_OTHER): Payer: No Typology Code available for payment source | Admitting: Medical-Surgical

## 2021-11-30 VITALS — BP 126/78 | HR 76 | Ht 63.0 in | Wt 172.0 lb

## 2021-11-30 DIAGNOSIS — G4733 Obstructive sleep apnea (adult) (pediatric): Secondary | ICD-10-CM | POA: Diagnosis not present

## 2021-11-30 DIAGNOSIS — I10 Essential (primary) hypertension: Secondary | ICD-10-CM

## 2021-11-30 DIAGNOSIS — Z9884 Bariatric surgery status: Secondary | ICD-10-CM | POA: Diagnosis not present

## 2021-11-30 DIAGNOSIS — E119 Type 2 diabetes mellitus without complications: Secondary | ICD-10-CM | POA: Diagnosis not present

## 2021-11-30 DIAGNOSIS — Z1231 Encounter for screening mammogram for malignant neoplasm of breast: Secondary | ICD-10-CM

## 2021-11-30 LAB — POCT GLYCOSYLATED HEMOGLOBIN (HGB A1C): HbA1c, POC (controlled diabetic range): 5.1 % (ref 0.0–7.0)

## 2021-11-30 MED ORDER — PANTOPRAZOLE SODIUM 20 MG PO TBEC
20.0000 mg | DELAYED_RELEASE_TABLET | Freq: Every day | ORAL | 1 refills | Status: DC
  Filled 2021-11-30: qty 90, 90d supply, fill #0

## 2021-11-30 NOTE — Progress Notes (Deleted)
   Established Patient Office Visit  Subjective   Patient ID: Olivia Hodges, female   DOB: Aug 08, 1975 Age: 46 y.o. MRN: [id number]   No chief complaint on file.   HPI    Objective:    There were no vitals filed for this visit.  Physical Exam   No results found for this or any previous visit (from the past 24 hour(s)).   {Labs (Optional):23779}  The 10-year ASCVD risk score (Arnett DK, et al., 2019) is: 3.2%   Values used to calculate the score:     Age: 77 years     Sex: Female     Is Non-Hispanic African American: No     Diabetic: Yes     Tobacco smoker: No     Systolic Blood Pressure: 530 mmHg     Is BP treated: Yes     HDL Cholesterol: 50 mg/dL     Total Cholesterol: 163 mg/dL   Assessment & Plan:   No problem-specific Assessment & Plan notes found for this encounter.   No follow-ups on file.  ___________________________________________ Clearnce Sorrel, DNP, APRN, FNP-BC Primary Care and [place]

## 2021-11-30 NOTE — Progress Notes (Signed)
Medical screening examination/treatment was performed by qualified nurse practitioner student and as supervising provider I was immediately available for consultation/collaboration. I have reviewed documentation and agree with assessment and plan. ° °Pride Gonzales L. Tommie Bohlken, DNP, APRN, FNP-BC °[facility] °Primary Care and Sports Medicine ° °

## 2021-11-30 NOTE — Progress Notes (Signed)
Established Patient Office Visit  Subjective   Patient ID: Olivia Hodges, female    DOB: [birth year]  Age: 46 y.o. MRN: [id number]  Chief Complaint  Patient presents with   Diabetes      Anhar is here today for the following chronic disease follow-up:   High Blood Pressure:   Medication: Hydrochlorothiazide (HYDRODIURIL) 25 mg and Losartan (Cozaar) 100 mg. Compliant: She takes the hydrochlorothiazide as prescribe, however, she reports she titrated herself off of the losartan since August.  Side effects: None reported Checking BP at home: yes, readings Systolic of 245-809X; diastolic between 83-38S mmHg.  Low sodium diet: yes Exercise: as much as tolerated given prior hx of hip and back issues;  Concerning symptoms: none reported.    Diabetes:  She reports that she is not on any diabetic medications and never was. She does not take her sugar routinely but she consumes minimum carbohydrates.   OSA: She reports no issues with her sleep apnea and uses her CPAP machine at night time.   History of Gastric Bypass: She reports tolerating a regular consistency food items in small amounts specially bread, pasta and the likes of it. She reports attempted to stop taking her prescribed Pantroprazole for reflux, but this was unsuccessful.   Breast cancer screening:  She reports family history (mother and sister) of breast cancer.   Review of Systems  Constitutional:  Positive for weight loss.  HENT: Negative.    Eyes: Negative.   Respiratory: Negative.    Cardiovascular: Negative.   Gastrointestinal:  Positive for heartburn.  Genitourinary: Negative.   Musculoskeletal:  Positive for back pain and joint pain.  Skin: Negative.   Neurological: Negative.   Psychiatric/Behavioral: Negative.        Objective:     BP 126/78   Pulse 76   Ht 5\' 3"  (1.6 m)   Wt 78 kg   SpO2 100%   BMI 30.47 kg/m    Physical Exam Constitutional:      General: She is not in acute distress.     Appearance: Normal appearance. She is not ill-appearing, toxic-appearing or diaphoretic.  HENT:     Head: Normocephalic and atraumatic.  Eyes:     General: No scleral icterus. Cardiovascular:     Rate and Rhythm: Normal rate and regular rhythm.     Pulses: Normal pulses.     Heart sounds: Normal heart sounds.  Pulmonary:     Effort: Pulmonary effort is normal.     Breath sounds: Normal breath sounds.  Neurological:     General: No focal deficit present.     Mental Status: She is alert and oriented to person, place, and time. Mental status is at baseline.  Psychiatric:        Mood and Affect: Mood normal.        Behavior: Behavior normal.        Thought Content: Thought content normal.        Judgment: Judgment normal.      Results for orders placed or performed in visit on 11/30/21  POCT HgB A1C  Result Value Ref Range   Hemoglobin A1C     HbA1c POC (<> result, manual entry)     HbA1c, POC (prediabetic range)     HbA1c, POC (controlled diabetic range) 5.1 0.0 - 7.0 %      The 10-year ASCVD risk score (Arnett DK, et al., 2019) is: 2.2%    Assessment & Plan:   1. Essential (primary) hypertension  Continue with heart healthy diet as well as physical activity as tolerated. We will go ahead and discontinue losartan. Continue checking your blood pressure at home and take hydrochlorothiazide 25 mg daily.   2. Controlled type 2 diabetes mellitus without complication, without long-term current use of insulin (HCC) A1C levels are great! Continue to consume a minimum amount of simple carbohydrates with your daily food intake.  - POCT HgB A1C  3. OSA on CPAP Continue using your CPAP at nighttime. We can reconsider re-doing the sleep study if needed.  4. History of gastric bypass We will send a prescription for a lower dosage of pantoprazole 20 mg and see if this can help her reflux instead of fully discontinuing it, if not, resume the 40 mg dose. -Pantoprazole (Protonix) 20  mg, PO, QD.   5. Breast cancer screening by mammogram We placed an order for mammogram screening.    Return in about 6 months (around 05/31/2022), or Chronic Disease Follow Up.    Ralph Leyden, RN Student NP

## 2021-12-03 ENCOUNTER — Ambulatory Visit (INDEPENDENT_AMBULATORY_CARE_PROVIDER_SITE_OTHER): Payer: No Typology Code available for payment source

## 2021-12-03 DIAGNOSIS — Z1231 Encounter for screening mammogram for malignant neoplasm of breast: Secondary | ICD-10-CM

## 2021-12-29 ENCOUNTER — Other Ambulatory Visit (HOSPITAL_COMMUNITY): Payer: Self-pay

## 2022-01-06 IMAGING — DX DG CERVICAL SPINE 2 OR 3 VIEWS
4 series · 4 of 4 positions shown · non-contrast
Comparison: None.

CLINICAL DATA: Cervicalgia

EXAM:
CERVICAL SPINE - 2-3 VIEW

[c-spine lat]
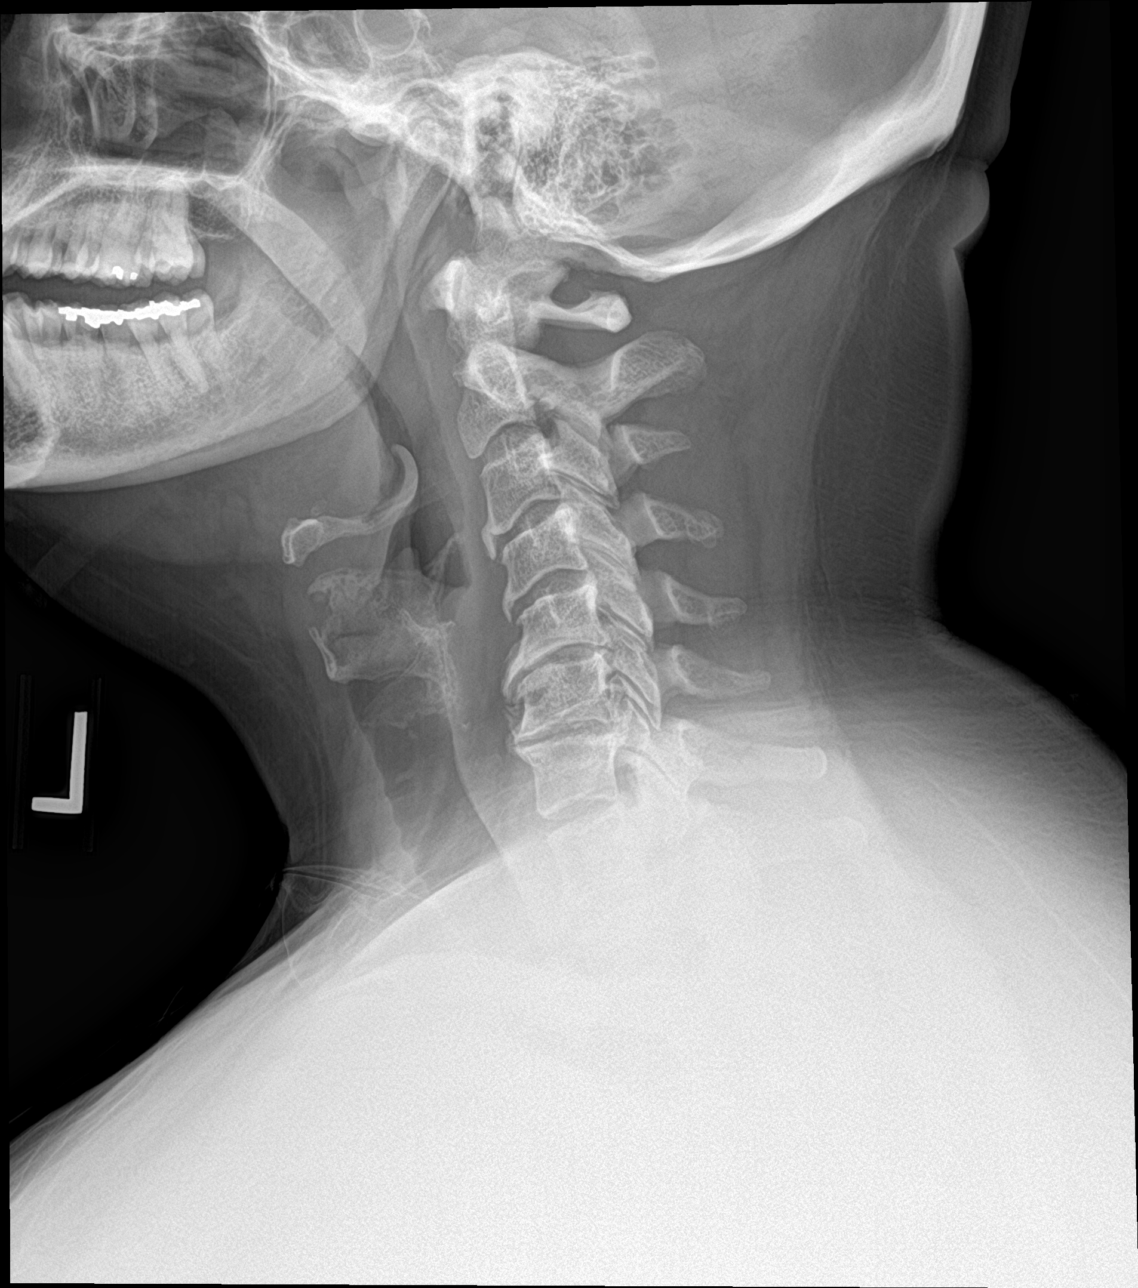

[c-spine ap]
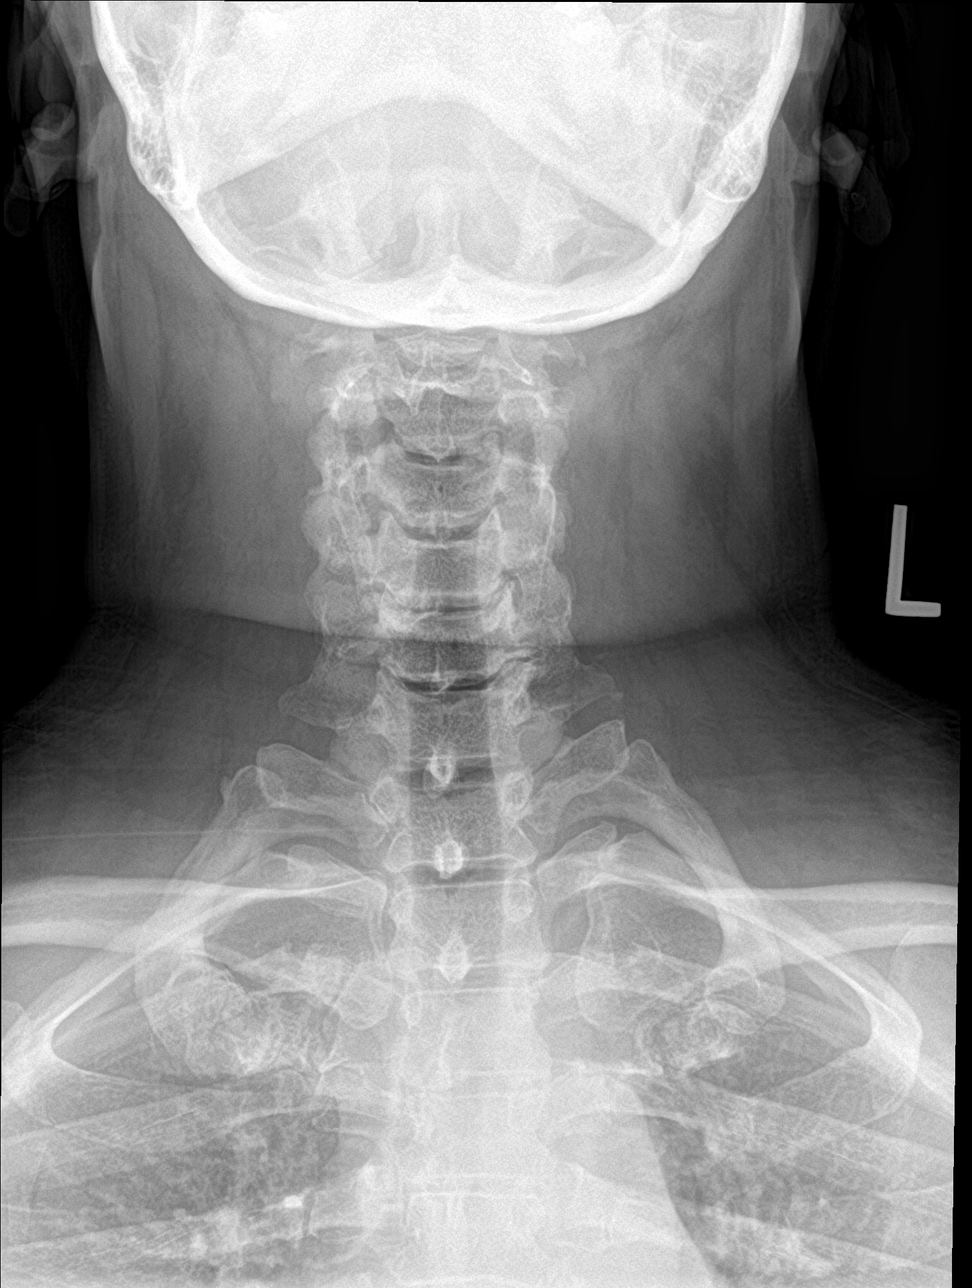

[c-spine open mouth]
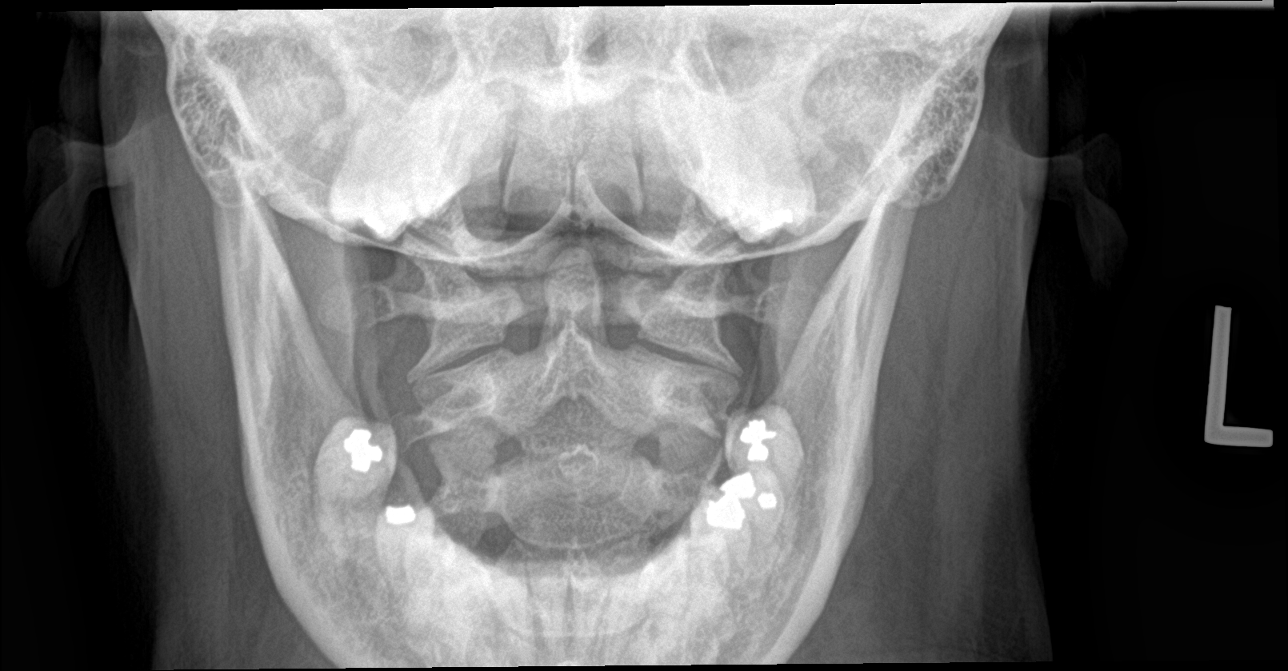

[c-spine swimmers]
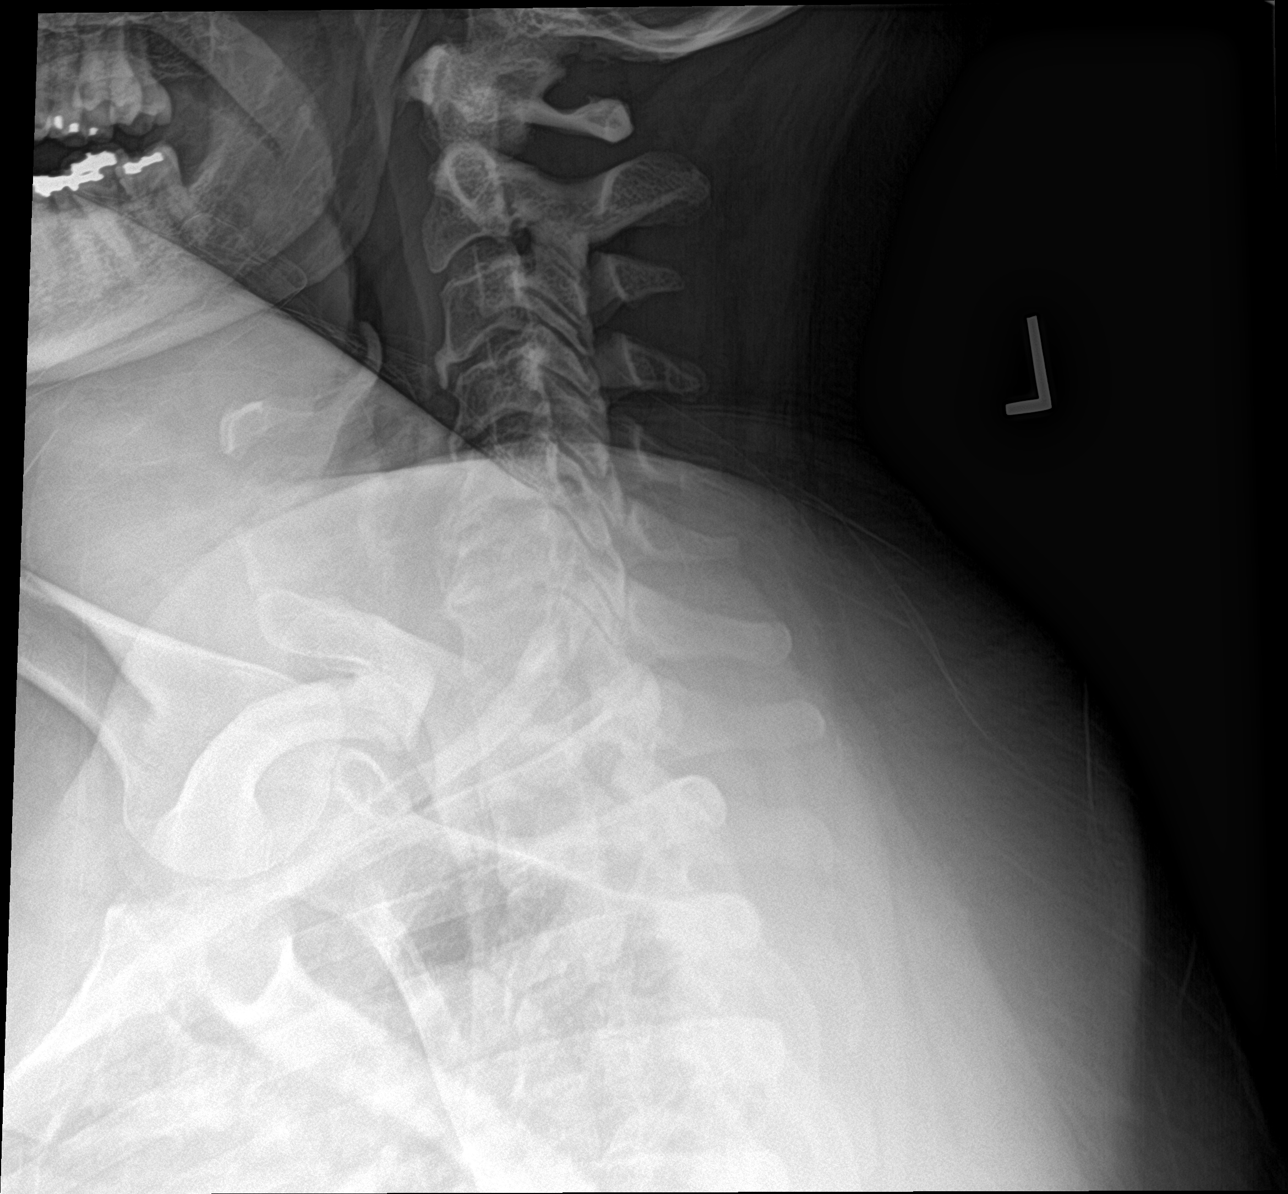

[4 of 4 positions shown; findings below may reference images not displayed]

FINDINGS: Frontal, lateral, and open-mouth odontoid images were obtained.
There is no fracture or spondylolisthesis. Prevertebral soft tissues
and predental space regions are normal. There is moderately severe
disc space narrowing at C5-6 and C6-7 with milder disc space
narrowing at C7-T1. There are prominent anterior osteophytes at C5,
C6, and C7, largest at C5. No erosive change. Lung apices are clear.
There is reversal of lordotic curvature.
IMPRESSION: Osteoarthritic change at several levels, most severe at C5-6 and
C6-7. No fracture or spondylolisthesis. Reversal of lordotic
curvature is likely indicative of a degree of muscle spasm.

## 2022-01-13 ENCOUNTER — Ambulatory Visit: Payer: No Typology Code available for payment source | Admitting: Skilled Nursing Facility1

## 2022-01-19 ENCOUNTER — Other Ambulatory Visit: Payer: Self-pay

## 2022-01-19 ENCOUNTER — Other Ambulatory Visit: Payer: Self-pay | Admitting: Medical-Surgical

## 2022-01-19 MED ORDER — HYDROCHLOROTHIAZIDE 25 MG PO TABS
25.0000 mg | ORAL_TABLET | Freq: Every day | ORAL | 1 refills | Status: DC
  Filled 2022-01-19: qty 90, 90d supply, fill #0
  Filled 2022-08-18: qty 90, 90d supply, fill #1

## 2022-02-17 ENCOUNTER — Ambulatory Visit: Payer: 59 | Admitting: Skilled Nursing Facility1

## 2022-02-22 ENCOUNTER — Encounter: Payer: Self-pay | Admitting: Medical-Surgical

## 2022-02-22 ENCOUNTER — Other Ambulatory Visit: Payer: Self-pay | Admitting: Medical-Surgical

## 2022-02-23 ENCOUNTER — Other Ambulatory Visit (HOSPITAL_COMMUNITY): Payer: Self-pay

## 2022-02-23 MED ORDER — OXYBUTYNIN CHLORIDE ER 10 MG PO TB24
10.0000 mg | ORAL_TABLET | Freq: Every day | ORAL | 1 refills | Status: DC
  Filled 2022-02-23: qty 90, 90d supply, fill #0
  Filled 2022-05-23: qty 90, 90d supply, fill #1

## 2022-02-24 ENCOUNTER — Other Ambulatory Visit: Payer: Self-pay

## 2022-02-24 ENCOUNTER — Other Ambulatory Visit (HOSPITAL_COMMUNITY): Payer: Self-pay

## 2022-02-24 ENCOUNTER — Other Ambulatory Visit: Payer: Self-pay | Admitting: Medical-Surgical

## 2022-02-24 MED ORDER — PANTOPRAZOLE SODIUM 40 MG PO TBEC
40.0000 mg | DELAYED_RELEASE_TABLET | Freq: Every day | ORAL | 3 refills | Status: DC
  Filled 2022-02-24: qty 90, 90d supply, fill #0
  Filled 2022-05-23: qty 90, 90d supply, fill #1
  Filled 2022-08-18: qty 90, 90d supply, fill #2
  Filled 2022-11-09: qty 90, 90d supply, fill #3

## 2022-03-29 ENCOUNTER — Other Ambulatory Visit: Payer: Self-pay | Admitting: Medical-Surgical

## 2022-03-29 ENCOUNTER — Other Ambulatory Visit: Payer: Self-pay

## 2022-03-29 ENCOUNTER — Other Ambulatory Visit (HOSPITAL_COMMUNITY): Payer: Self-pay

## 2022-03-30 ENCOUNTER — Other Ambulatory Visit: Payer: Self-pay

## 2022-03-30 MED ORDER — TIZANIDINE HCL 4 MG PO TABS
4.0000 mg | ORAL_TABLET | Freq: Every day | ORAL | 0 refills | Status: DC
  Filled 2022-03-30: qty 90, 90d supply, fill #0

## 2022-05-24 ENCOUNTER — Other Ambulatory Visit: Payer: Self-pay

## 2022-06-22 ENCOUNTER — Encounter: Payer: Self-pay | Admitting: Medical-Surgical

## 2022-06-22 ENCOUNTER — Ambulatory Visit (INDEPENDENT_AMBULATORY_CARE_PROVIDER_SITE_OTHER): Payer: 59 | Admitting: Medical-Surgical

## 2022-06-22 ENCOUNTER — Other Ambulatory Visit (HOSPITAL_COMMUNITY): Payer: Self-pay

## 2022-06-22 ENCOUNTER — Other Ambulatory Visit: Payer: Self-pay

## 2022-06-22 VITALS — BP 122/82 | HR 74 | Resp 20 | Ht 63.0 in | Wt 157.1 lb

## 2022-06-22 DIAGNOSIS — E119 Type 2 diabetes mellitus without complications: Secondary | ICD-10-CM

## 2022-06-22 DIAGNOSIS — R809 Proteinuria, unspecified: Secondary | ICD-10-CM

## 2022-06-22 DIAGNOSIS — G4733 Obstructive sleep apnea (adult) (pediatric): Secondary | ICD-10-CM | POA: Diagnosis not present

## 2022-06-22 DIAGNOSIS — Z9884 Bariatric surgery status: Secondary | ICD-10-CM | POA: Diagnosis not present

## 2022-06-22 DIAGNOSIS — I10 Essential (primary) hypertension: Secondary | ICD-10-CM

## 2022-06-22 LAB — CBC WITH DIFFERENTIAL/PLATELET
Lymphs Abs: 1676 cells/uL (ref 850–3900)
Neutrophils Relative %: 61.8 %
RDW: 12.1 % (ref 11.0–15.0)

## 2022-06-22 LAB — POCT GLYCOSYLATED HEMOGLOBIN (HGB A1C): Hemoglobin A1C: 5.1 % (ref 4.0–5.6)

## 2022-06-22 LAB — POCT UA - MICROALBUMIN
Creatinine, POC: 10 mg/dL
Microalbumin Ur, POC: 10 mg/L

## 2022-06-22 MED ORDER — NYSTATIN 100000 UNIT/GM EX POWD
CUTANEOUS | 11 refills | Status: AC
  Filled 2022-06-22: qty 60, 30d supply, fill #0

## 2022-06-22 MED ORDER — LOSARTAN POTASSIUM 25 MG PO TABS
12.5000 mg | ORAL_TABLET | Freq: Every day | ORAL | 1 refills | Status: DC
  Filled 2022-06-22: qty 45, 90d supply, fill #0
  Filled 2022-09-16: qty 45, 90d supply, fill #1

## 2022-06-22 NOTE — Progress Notes (Signed)
        Established patient visit  History, exam, impression, and plan:  1. Controlled type 2 diabetes mellitus without complication, without long-term current use of insulin [facility]) Very pleasant 47 year old female presenting today with a history of type 2 diabetes that is controlled without medications.  Her last hemoglobin A1c was 5.1% approximately 6 months ago.  She does not check blood sugars at home although she has a glucometer to use if needed.  Reports doing well with her diet although she does admit to some stress eating lately as they are buying a new house.  Recheck of hemoglobin A1c today at 5.1%.  POCT microalbumin completed with abnormal results.  Reviewed recommendations for treatment of microalbumin.  Previously took losartan but has been off of this since her blood pressure has been extremely well-controlled.  Would like to restart a very low-dose of losartan 12.5 mg daily for renal protection.  Patient is agreeable so sending to the pharmacy today. - POCT HgB A1C - POCT UA - Microalbumin  2. Essential (primary) hypertension Blood pressure previously treated with losartan but with significant weight loss, has been well-managed with diet and lifestyle modifications.  Follows a low-sodium diet and stays active as tolerated.  Denies CP, SOB, LE edema, palpitations, HA, vision changes, and dizziness.  On exam, HRR, S1/S2 normal.  Lungs CTA.  Respirations even and unlabored.  Blood pressure at goal today.  Restarting losartan 12.5 mg daily as noted above.  Would like for her to check her blood pressure at home with a goal of 130/80 or less and monitor for hypotension on this medication.  Patient is agreeable. - CBC with Differential/Platelet - COMPLETE METABOLIC PANEL WITH GFR - Lipid panel  3. OSA on CPAP History of OSA and wears a CPAP nightly.  Feels like she is doing well with the CPAP and has no current concerns.  4. History of gastric bypass She does have a history of gastric  bypass with significant weight loss.  Is due for yearly labs and has not been able to see her surgeon.  Would like to get those drawn today.  Ordering labs as below. - TSH - Iron, TIBC and Ferritin Panel - Vitamin B12 - Folate RBC - VITAMIN D 25 Hydroxy (Vit-D Deficiency, Fractures) - Vitamin B1 - Zinc - Copper, serum  5. Microalbuminuria As noted above, urine microalbumin abnormal.  Starting low-dose losartan for renal protection. - losartan (COZAAR) 25 MG tablet; Take 0.5 tablets (12.5 mg total) by mouth daily.  Dispense: 45 tablet; Refill: 1  Procedures performed this visit: None.  Return in about 6 months (around 12/23/2022) for chronic disease follow up.  __________________________________ Thayer Ohm, DNP, APRN, FNP-BC Primary Care and Sports Medicine [facility] [place]

## 2022-06-23 LAB — COMPLETE METABOLIC PANEL WITH GFR
ALT: 19 U/L (ref 6–29)
AST: 24 U/L (ref 10–35)
Alkaline phosphatase (APISO): 46 U/L (ref 31–125)
BUN: 11 mg/dL (ref 7–25)
Chloride: 102 mmol/L (ref 98–110)

## 2022-06-23 LAB — IRON,TIBC AND FERRITIN PANEL
%SAT: 21 % (calc) (ref 16–45)
Ferritin: 19 ng/mL (ref 16–232)

## 2022-06-23 LAB — TSH: TSH: 0.94 mIU/L

## 2022-06-23 LAB — CBC WITH DIFFERENTIAL/PLATELET
HCT: 37.3 % (ref 35.0–45.0)
Monocytes Relative: 8.1 %
Platelets: 302 10*3/uL (ref 140–400)

## 2022-06-23 LAB — VITAMIN D 25 HYDROXY (VIT D DEFICIENCY, FRACTURES): Vit D, 25-Hydroxy: 36 ng/mL (ref 30–100)

## 2022-06-24 LAB — COMPLETE METABOLIC PANEL WITH GFR
AG Ratio: 1.6 (calc) (ref 1.0–2.5)
Glucose, Bld: 91 mg/dL (ref 65–139)
Total Protein: 7.7 g/dL (ref 6.1–8.1)

## 2022-06-24 LAB — CBC WITH DIFFERENTIAL/PLATELET
Neutro Abs: 3584 cells/uL (ref 1500–7800)
RBC: 3.93 10*6/uL (ref 3.80–5.10)

## 2022-06-24 LAB — LIPID PANEL: HDL: 69 mg/dL (ref >=50)

## 2022-06-26 LAB — LIPID PANEL
Cholesterol: 169 mg/dL (ref <200)
Total CHOL/HDL Ratio: 2.4 (calc) (ref <5.0)

## 2022-06-26 LAB — CBC WITH DIFFERENTIAL/PLATELET
Basophils Absolute: 29 cells/uL (ref 0–200)
Eosinophils Relative: 0.7 %
MCV: 94.9 fL (ref 80.0–100.0)
WBC: 5.8 10*3/uL (ref 3.8–10.8)

## 2022-06-26 LAB — COMPLETE METABOLIC PANEL WITH GFR: Potassium: 3.8 mmol/L (ref 3.5–5.3)

## 2022-06-27 LAB — FOLATE RBC: RBC Folate: 621 ng/mL RBC (ref >280)

## 2022-06-27 LAB — CBC WITH DIFFERENTIAL/PLATELET
Absolute Monocytes: 470 cells/uL (ref 200–950)
Hemoglobin: 12.5 g/dL (ref 11.7–15.5)
MCH: 31.8 pg (ref 27.0–33.0)
MPV: 10.1 fL (ref 7.5–12.5)
Total Lymphocyte: 28.9 %

## 2022-06-27 LAB — COMPLETE METABOLIC PANEL WITH GFR
Albumin: 4.7 g/dL (ref 3.6–5.1)
Calcium: 10 mg/dL (ref 8.6–10.2)
Total Bilirubin: 0.3 mg/dL (ref 0.2–1.2)
eGFR: 104 mL/min/{1.73_m2} (ref >=60)

## 2022-06-27 LAB — IRON,TIBC AND FERRITIN PANEL
Iron: 70 ug/dL (ref 40–190)
TIBC: 326 mcg/dL (calc) (ref 250–450)

## 2022-06-27 LAB — LIPID PANEL: LDL Cholesterol (Calc): 80 mg/dL (calc)

## 2022-06-27 LAB — COPPER, SERUM: Copper: 95 ug/dL (ref 70–175)

## 2022-06-28 LAB — COMPLETE METABOLIC PANEL WITH GFR
CO2: 25 mmol/L (ref 20–32)
Creat: 0.71 mg/dL (ref 0.50–0.99)
Globulin: 3 g/dL (calc) (ref 1.9–3.7)
Sodium: 140 mmol/L (ref 135–146)

## 2022-06-28 LAB — LIPID PANEL
Non-HDL Cholesterol (Calc): 100 mg/dL (calc) (ref <130)
Triglycerides: 103 mg/dL (ref <150)

## 2022-06-28 LAB — VITAMIN B1: Vitamin B1 (Thiamine): 17 nmol/L (ref 8–30)

## 2022-06-28 LAB — ZINC: Zinc: 48 ug/dL — ABNORMAL LOW (ref 60–130)

## 2022-06-28 LAB — CBC WITH DIFFERENTIAL/PLATELET
Basophils Relative: 0.5 %
Eosinophils Absolute: 41 cells/uL (ref 15–500)
MCHC: 33.5 g/dL (ref 32.0–36.0)

## 2022-06-28 LAB — VITAMIN B12: Vitamin B-12: 313 pg/mL (ref 200–1100)

## 2022-07-08 ENCOUNTER — Other Ambulatory Visit: Payer: Self-pay | Admitting: Medical-Surgical

## 2022-07-08 ENCOUNTER — Other Ambulatory Visit (HOSPITAL_COMMUNITY): Payer: Self-pay

## 2022-07-08 ENCOUNTER — Other Ambulatory Visit: Payer: Self-pay

## 2022-07-08 MED ORDER — TIZANIDINE HCL 4 MG PO TABS
4.0000 mg | ORAL_TABLET | Freq: Every day | ORAL | 0 refills | Status: DC
  Filled 2022-07-08: qty 90, 90d supply, fill #0

## 2022-07-09 ENCOUNTER — Other Ambulatory Visit: Payer: Self-pay

## 2022-07-22 ENCOUNTER — Encounter: Payer: Self-pay | Admitting: Medical-Surgical

## 2022-08-02 ENCOUNTER — Ambulatory Visit (INDEPENDENT_AMBULATORY_CARE_PROVIDER_SITE_OTHER): Payer: 59 | Admitting: Medical-Surgical

## 2022-08-02 ENCOUNTER — Encounter: Payer: Self-pay | Admitting: Medical-Surgical

## 2022-08-02 VITALS — BP 114/71 | HR 67 | Resp 20 | Ht 63.0 in | Wt 154.0 lb

## 2022-08-02 DIAGNOSIS — Z0000 Encounter for general adult medical examination without abnormal findings: Secondary | ICD-10-CM

## 2022-08-02 DIAGNOSIS — Z124 Encounter for screening for malignant neoplasm of cervix: Secondary | ICD-10-CM

## 2022-08-02 NOTE — Progress Notes (Signed)
Complete physical exam  Patient: Olivia Hodges   DOB: [birth year]   47 y.o. Female  MRN: [id number]  Subjective:    Chief Complaint  Patient presents with   Annual Exam    Marlette Njoku is a 47 y.o. female who presents today for a complete physical exam. She reports consuming a general diet.  Swimming for exercise.  She generally feels well. She reports sleeping fairly well. She does not have additional problems to discuss today.    Most recent fall risk assessment:    06/22/2022    2:55 PM  Fall Risk   Falls in the past year? 0  Number falls in past yr: 0  Injury with Fall? 0  Risk for fall due to : No Fall Risks  Follow up Falls evaluation completed     Most recent depression screenings:    06/22/2022    2:55 PM 05/28/2021    2:33 PM  PHQ 2/9 Scores  PHQ - 2 Score 0 0    Vision:Within last year, Dental: No current dental problems and Receives regular dental care, and STD: The patient denies history of sexually transmitted disease.    Patient Care Team: Christen Butter, NP as PCP - General (Nurse Practitioner)   Outpatient Medications Prior to Visit  Medication Sig   acetaminophen (TYLENOL) 500 MG tablet Take 1,000 mg by mouth every 6 (six) hours as needed for moderate pain or headache.   AMBULATORY NON FORMULARY MEDICATION Supply ordered: CPAP and other supplies needed (headgear, cushions, filters, heated tuubing and water chamber). Patient needs tubing and face mask!  Dx: obstructive sleep apnea Settings: auto-titration 5-20 cmH2O   calcium carbonate (OS-CAL) 1250 (500 Ca) MG chewable tablet Chew 1 tablet by mouth daily.   hydrochlorothiazide (HYDRODIURIL) 25 MG tablet Take 1 tablet (25 mg total) by mouth daily.   levocetirizine (XYZAL) 5 MG tablet Take 1 tablet (5 mg total) by mouth every evening.   losartan (COZAAR) 25 MG tablet Take 0.5 tablets (12.5 mg total) by mouth daily.   montelukast (SINGULAIR) 10 MG tablet TAKE 1 TABLET BY MOUTH EVERY NIGHT AT BEDTIME    Multiple Vitamin (MULTIVITAMIN) capsule Take 1 capsule by mouth daily.   nystatin powder Apply liberally to the affected area(s) twice daily as directed   oxybutynin (DITROPAN XL) 10 MG 24 hr tablet Take 1 tablet (10 mg total) by mouth at bedtime.   pantoprazole (PROTONIX) 40 MG tablet Take 1 tablet (40 mg total) by mouth daily.   tiZANidine (ZANAFLEX) 4 MG tablet Take 1 tablet (4 mg total) by mouth at bedtime.   No facility-administered medications prior to visit.   Review of Systems  Constitutional:  Negative for chills, fever, malaise/fatigue and weight loss.  HENT:  Negative for congestion, ear pain, hearing loss, sinus pain and sore throat.   Eyes:  Negative for blurred vision, photophobia and pain.  Respiratory:  Negative for cough, shortness of breath and wheezing.   Cardiovascular:  Negative for chest pain, palpitations and leg swelling.  Gastrointestinal:  Negative for abdominal pain, constipation, diarrhea, heartburn, nausea and vomiting.  Genitourinary:  Negative for dysuria, frequency and urgency.  Musculoskeletal:  Negative for falls and neck pain.  Skin:  Negative for itching and rash.  Neurological:  Negative for dizziness, weakness and headaches.  Endo/Heme/Allergies:  Negative for polydipsia. Does not bruise/bleed easily.  Psychiatric/Behavioral:  Negative for depression, substance abuse and suicidal ideas. The patient is not nervous/anxious.      Objective:  BP 114/71 (BP Location: Left Arm, Cuff Size: Normal)   Pulse 67   Resp 20   Ht 5\' 3"  (1.6 m)   Wt 154 lb (69.9 kg)   SpO2 100%   BMI 27.28 kg/m    Physical Exam   No results found for any visits on 08/02/22.     Assessment & Plan:    Routine Health Maintenance and Physical Exam  Immunization History  Administered Date(s) Administered   Influenza-Unspecified 11/07/2018, 11/06/2020, 11/03/2021   PFIZER(Purple Top)SARS-COV-2 Vaccination 02/05/2019, 03/16/2019   Tdap 05/25/2014, 03/16/2019     Health Maintenance  Topic Date Due   FOOT EXAM  Never done   OPHTHALMOLOGY EXAM  Never done   PAP SMEAR-Modifier  Never done   COVID-19 Vaccine (3 - 2023-24 season) 08/18/2022 (Originally 09/25/2021)   Hepatitis C Screening  08/02/2023 (Originally 01/30/1991)   HIV Screening  08/02/2023 (Originally 01/30/1988)   INFLUENZA VACCINE  08/26/2022   HEMOGLOBIN A1C  12/23/2022   Diabetic kidney evaluation - eGFR measurement  06/22/2023   Diabetic kidney evaluation - Urine ACR  06/22/2023   Fecal DNA (Cologuard)  08/03/2024   DTaP/Tdap/Td (3 - Td or Tdap) 03/15/2029   HPV VACCINES  Aged Out    Discussed health benefits of physical activity, and encouraged her to engage in regular exercise appropriate for her age and condition.  1. Annual physical exam Deferring labs today.  Recently checked with no concerns.  Up-to-date on preventative care.  Wellness information provided with AVS.  2. Cervical cancer screening Overdue for cervical cancer screening and cannot remove the last time she had this completed.  Does not want to proceed with a Pap smear today.  Return in about 1 year (around 08/02/2023) for annual physical exam.   Christen Butter, NP

## 2022-08-10 ENCOUNTER — Encounter (HOSPITAL_COMMUNITY): Payer: Self-pay | Admitting: *Deleted

## 2022-08-18 ENCOUNTER — Other Ambulatory Visit (HOSPITAL_COMMUNITY): Payer: Self-pay

## 2022-08-20 ENCOUNTER — Other Ambulatory Visit: Payer: Self-pay | Admitting: Physician Assistant

## 2022-08-23 MED ORDER — OXYBUTYNIN CHLORIDE ER 10 MG PO TB24
10.0000 mg | ORAL_TABLET | Freq: Every day | ORAL | 1 refills | Status: DC
  Filled 2022-08-23: qty 90, 90d supply, fill #0
  Filled 2022-11-09: qty 90, 90d supply, fill #1

## 2022-08-24 ENCOUNTER — Other Ambulatory Visit: Payer: Self-pay

## 2022-09-02 ENCOUNTER — Encounter: Payer: Self-pay | Admitting: Medical-Surgical

## 2022-09-16 ENCOUNTER — Other Ambulatory Visit: Payer: Self-pay

## 2022-09-29 ENCOUNTER — Other Ambulatory Visit: Payer: Self-pay | Admitting: Medical-Surgical

## 2022-09-30 ENCOUNTER — Other Ambulatory Visit (HOSPITAL_BASED_OUTPATIENT_CLINIC_OR_DEPARTMENT_OTHER): Payer: Self-pay

## 2022-09-30 ENCOUNTER — Other Ambulatory Visit: Payer: Self-pay

## 2022-09-30 MED ORDER — LEVOCETIRIZINE DIHYDROCHLORIDE 5 MG PO TABS
5.0000 mg | ORAL_TABLET | Freq: Every evening | ORAL | 0 refills | Status: DC
  Filled 2022-09-30: qty 90, 90d supply, fill #0

## 2022-09-30 MED ORDER — TIZANIDINE HCL 4 MG PO TABS
4.0000 mg | ORAL_TABLET | Freq: Every day | ORAL | 0 refills | Status: DC
  Filled 2022-09-30: qty 90, 90d supply, fill #0

## 2022-09-30 MED ORDER — MONTELUKAST SODIUM 10 MG PO TABS
10.0000 mg | ORAL_TABLET | Freq: Every day | ORAL | 0 refills | Status: DC
  Filled 2022-09-30: qty 90, 90d supply, fill #0

## 2022-10-13 DIAGNOSIS — Z9884 Bariatric surgery status: Secondary | ICD-10-CM | POA: Diagnosis not present

## 2022-10-31 ENCOUNTER — Ambulatory Visit: Payer: 59

## 2022-11-01 ENCOUNTER — Ambulatory Visit: Payer: 59

## 2022-11-02 ENCOUNTER — Ambulatory Visit: Payer: 59 | Admitting: Medical-Surgical

## 2022-11-09 ENCOUNTER — Other Ambulatory Visit: Payer: Self-pay | Admitting: Medical-Surgical

## 2022-11-09 ENCOUNTER — Other Ambulatory Visit (HOSPITAL_COMMUNITY): Payer: Self-pay

## 2022-11-09 ENCOUNTER — Other Ambulatory Visit: Payer: Self-pay

## 2022-11-11 ENCOUNTER — Other Ambulatory Visit: Payer: Self-pay

## 2022-11-11 MED ORDER — HYDROCHLOROTHIAZIDE 25 MG PO TABS
25.0000 mg | ORAL_TABLET | Freq: Every day | ORAL | 0 refills | Status: DC
  Filled 2022-11-11: qty 90, 90d supply, fill #0

## 2022-12-08 ENCOUNTER — Other Ambulatory Visit: Payer: Self-pay | Admitting: Medical-Surgical

## 2022-12-08 DIAGNOSIS — Z1231 Encounter for screening mammogram for malignant neoplasm of breast: Secondary | ICD-10-CM

## 2022-12-13 ENCOUNTER — Other Ambulatory Visit: Payer: Self-pay | Admitting: Medical-Surgical

## 2022-12-13 DIAGNOSIS — Z9884 Bariatric surgery status: Secondary | ICD-10-CM

## 2022-12-13 DIAGNOSIS — R809 Proteinuria, unspecified: Secondary | ICD-10-CM

## 2022-12-15 ENCOUNTER — Other Ambulatory Visit: Payer: Self-pay

## 2022-12-15 ENCOUNTER — Other Ambulatory Visit (HOSPITAL_COMMUNITY): Payer: Self-pay

## 2022-12-15 MED ORDER — LOSARTAN POTASSIUM 25 MG PO TABS
12.5000 mg | ORAL_TABLET | Freq: Every day | ORAL | 0 refills | Status: DC
  Filled 2022-12-15: qty 45, 90d supply, fill #0

## 2022-12-27 ENCOUNTER — Other Ambulatory Visit (HOSPITAL_COMMUNITY): Payer: Self-pay

## 2022-12-27 ENCOUNTER — Encounter: Payer: Self-pay | Admitting: Medical-Surgical

## 2022-12-27 ENCOUNTER — Ambulatory Visit (INDEPENDENT_AMBULATORY_CARE_PROVIDER_SITE_OTHER): Payer: 59 | Admitting: Medical-Surgical

## 2022-12-27 VITALS — BP 130/82 | HR 59 | Resp 20 | Ht 63.0 in | Wt 165.9 lb

## 2022-12-27 DIAGNOSIS — Z23 Encounter for immunization: Secondary | ICD-10-CM | POA: Diagnosis not present

## 2022-12-27 DIAGNOSIS — E119 Type 2 diabetes mellitus without complications: Secondary | ICD-10-CM | POA: Diagnosis not present

## 2022-12-27 DIAGNOSIS — M1612 Unilateral primary osteoarthritis, left hip: Secondary | ICD-10-CM

## 2022-12-27 DIAGNOSIS — I10 Essential (primary) hypertension: Secondary | ICD-10-CM | POA: Diagnosis not present

## 2022-12-27 DIAGNOSIS — R809 Proteinuria, unspecified: Secondary | ICD-10-CM

## 2022-12-27 DIAGNOSIS — G4733 Obstructive sleep apnea (adult) (pediatric): Secondary | ICD-10-CM

## 2022-12-27 DIAGNOSIS — Z124 Encounter for screening for malignant neoplasm of cervix: Secondary | ICD-10-CM

## 2022-12-27 DIAGNOSIS — Z9884 Bariatric surgery status: Secondary | ICD-10-CM | POA: Diagnosis not present

## 2022-12-27 LAB — POCT GLYCOSYLATED HEMOGLOBIN (HGB A1C): Hemoglobin A1C: 5.3 % (ref 4.0–5.6)

## 2022-12-27 MED ORDER — LEVOCETIRIZINE DIHYDROCHLORIDE 5 MG PO TABS
5.0000 mg | ORAL_TABLET | Freq: Every evening | ORAL | 3 refills | Status: AC
  Filled 2022-12-27 – 2023-01-05 (×2): qty 90, 90d supply, fill #0

## 2022-12-27 MED ORDER — HYDROCODONE-ACETAMINOPHEN 5-325 MG PO TABS
1.0000 | ORAL_TABLET | Freq: Three times a day (TID) | ORAL | 0 refills | Status: DC | PRN
  Filled 2022-12-27 – 2023-04-09 (×3): qty 15, 5d supply, fill #0

## 2022-12-27 MED ORDER — MONTELUKAST SODIUM 10 MG PO TABS
10.0000 mg | ORAL_TABLET | Freq: Every day | ORAL | 3 refills | Status: DC
  Filled 2022-12-27 – 2023-01-05 (×2): qty 90, 90d supply, fill #0
  Filled 2023-04-04: qty 90, 90d supply, fill #1
  Filled 2023-06-29: qty 90, 90d supply, fill #2
  Filled 2023-09-27: qty 90, 90d supply, fill #3

## 2022-12-27 MED ORDER — HYDROCHLOROTHIAZIDE 25 MG PO TABS
25.0000 mg | ORAL_TABLET | Freq: Every day | ORAL | 3 refills | Status: AC
  Filled 2022-12-27 – 2023-02-24 (×2): qty 90, 90d supply, fill #0
  Filled 2023-05-20: qty 90, 90d supply, fill #1
  Filled 2023-08-18: qty 90, 90d supply, fill #2
  Filled 2023-12-23: qty 90, 90d supply, fill #3

## 2022-12-27 MED ORDER — TIZANIDINE HCL 4 MG PO TABS
4.0000 mg | ORAL_TABLET | Freq: Every day | ORAL | 3 refills | Status: DC
  Filled 2022-12-27 – 2023-01-05 (×2): qty 90, 90d supply, fill #0
  Filled 2023-04-04: qty 90, 90d supply, fill #1
  Filled 2023-06-29: qty 90, 90d supply, fill #2

## 2022-12-27 MED ORDER — PANTOPRAZOLE SODIUM 40 MG PO TBEC
40.0000 mg | DELAYED_RELEASE_TABLET | Freq: Every day | ORAL | 3 refills | Status: DC
  Filled 2022-12-27 – 2023-02-24 (×2): qty 90, 90d supply, fill #0
  Filled 2023-05-20: qty 90, 90d supply, fill #1
  Filled 2023-08-18: qty 90, 90d supply, fill #2
  Filled 2023-11-16: qty 90, 90d supply, fill #3

## 2022-12-27 MED ORDER — OXYBUTYNIN CHLORIDE ER 10 MG PO TB24
10.0000 mg | ORAL_TABLET | Freq: Every day | ORAL | 3 refills | Status: DC
  Filled 2022-12-27 – 2023-02-24 (×2): qty 90, 90d supply, fill #0

## 2022-12-27 MED ORDER — LOSARTAN POTASSIUM 25 MG PO TABS
12.5000 mg | ORAL_TABLET | Freq: Every day | ORAL | 3 refills | Status: DC
  Filled 2022-12-27 – 2023-03-15 (×2): qty 45, 90d supply, fill #0
  Filled 2023-06-09: qty 45, 90d supply, fill #1
  Filled 2023-09-07: qty 45, 90d supply, fill #2
  Filled 2023-12-05: qty 45, 90d supply, fill #3

## 2022-12-27 NOTE — Progress Notes (Signed)
Established patient visit  History, exam, impression, and plan:  1. Controlled type 2 diabetes mellitus without complication, without long-term current use of insulin [facility]) Pleasant 47 year old female presenting today for follow up on diabetes. Not currently on medications and is managed by diet and lifestyle. Has been having some episodes that seem to be reactive hypoglycemia where she will eat something sweet and an hour later, her sugar drops to the 40s and 50s. Has been working to increase protein and stay well hydrated. Stays physically active as much as possible. Last A1c 5.1% 6 months ago, recheck today at 5.3%. Recommend monitoring sugars for a pattern related to hypoglycemic episodes to find possible triggers.  - POCT HgB A1C  2. Essential (primary) hypertension Taking hydrochlorothiazide 25mg  daily and losartan 12.5mg  daily. Tolerating well without side effects. Denies concerning symptoms. BP at goal. Continue losartan and hydrochlorothiazide as prescribed.  - losartan (COZAAR) 25 MG tablet; Take 0.5 tablets (12.5 mg total) by mouth daily.  Dispense: 45 tablet; Refill: 3  3. OSA on CPAP Doing well on CPAP and compliant with recommended wear. Machine is UTD. Gets supplies off [employer] due to lower cost and ease of ordering. No current concerns.   4. Cervical cancer screening Overdue. Plans to schedule an appointment with her GYN.   5. History of gastric bypass UTD on labs for post-surgery monitoring.   6. Microalbuminuria UTD on microalbumin evaluation. Continue Losartan.  - losartan (COZAAR) 25 MG tablet; Take 0.5 tablets (12.5 mg total) by mouth daily.  Dispense: 45 tablet; Refill: 3  7. Primary osteoarthritis of left hip Long history of left hip OA and has been putting off surgical intervention. Now having a worsening of the pain that interferes with her ability to sleep some nights. Most of the time this is manageable but on rare nights, it's not. Plans to see an  orthopedic surgeon but would like to have something for pain for those really bad nights. Sending low dose Norco for very sparing use until she can get in with ortho.   8. Need for COVID-19 vaccine Covid vaccine given in office.  - Pfizer Comirnaty Covid -19 Vaccine 49yrs and older   Review of Systems  Constitutional:  Negative for chills, fever and malaise/fatigue.  Respiratory:  Negative for cough, shortness of breath and wheezing.   Cardiovascular:  Negative for chest pain, palpitations and leg swelling.  Neurological:  Negative for dizziness and headaches.  Psychiatric/Behavioral:  Negative for depression and suicidal ideas. The patient is not nervous/anxious and does not have insomnia.     Physical Exam Vitals reviewed.  Constitutional:      General: She is not in acute distress.    Appearance: Normal appearance.  HENT:     Head: Normocephalic and atraumatic.  Cardiovascular:     Rate and Rhythm: Normal rate and regular rhythm.     Pulses: Normal pulses.     Heart sounds: Normal heart sounds. No murmur heard.    No friction rub. No gallop.  Pulmonary:     Effort: Pulmonary effort is normal. No respiratory distress.     Breath sounds: Normal breath sounds. No wheezing.  Skin:    General: Skin is warm and dry.  Neurological:     Mental Status: She is alert and oriented to person, place, and time.  Psychiatric:        Mood and Affect: Mood normal.        Behavior: Behavior normal.  Thought Content: Thought content normal.        Judgment: Judgment normal.     Procedures performed this visit: None.  Return in about 6 months (around 06/27/2023) for chronic disease follow up.  __________________________________ Thayer Ohm, DNP, APRN, FNP-BC Primary Care and Sports Medicine [facility] [place]

## 2022-12-28 ENCOUNTER — Other Ambulatory Visit: Payer: Self-pay

## 2022-12-28 ENCOUNTER — Other Ambulatory Visit (HOSPITAL_COMMUNITY): Payer: Self-pay

## 2023-01-03 ENCOUNTER — Other Ambulatory Visit (HOSPITAL_COMMUNITY): Payer: Self-pay

## 2023-01-05 ENCOUNTER — Other Ambulatory Visit (HOSPITAL_COMMUNITY): Payer: Self-pay

## 2023-01-05 ENCOUNTER — Other Ambulatory Visit: Payer: Self-pay

## 2023-01-24 ENCOUNTER — Ambulatory Visit: Payer: 59

## 2023-01-24 DIAGNOSIS — Z1231 Encounter for screening mammogram for malignant neoplasm of breast: Secondary | ICD-10-CM | POA: Diagnosis not present

## 2023-02-03 IMAGING — DX DG CHEST 2V
2 series · 3 of 3 positions shown · non-contrast
Comparison: None.

CLINICAL DATA: Preop examination.

EXAM:
CHEST - 2 VIEW

[chest pa]
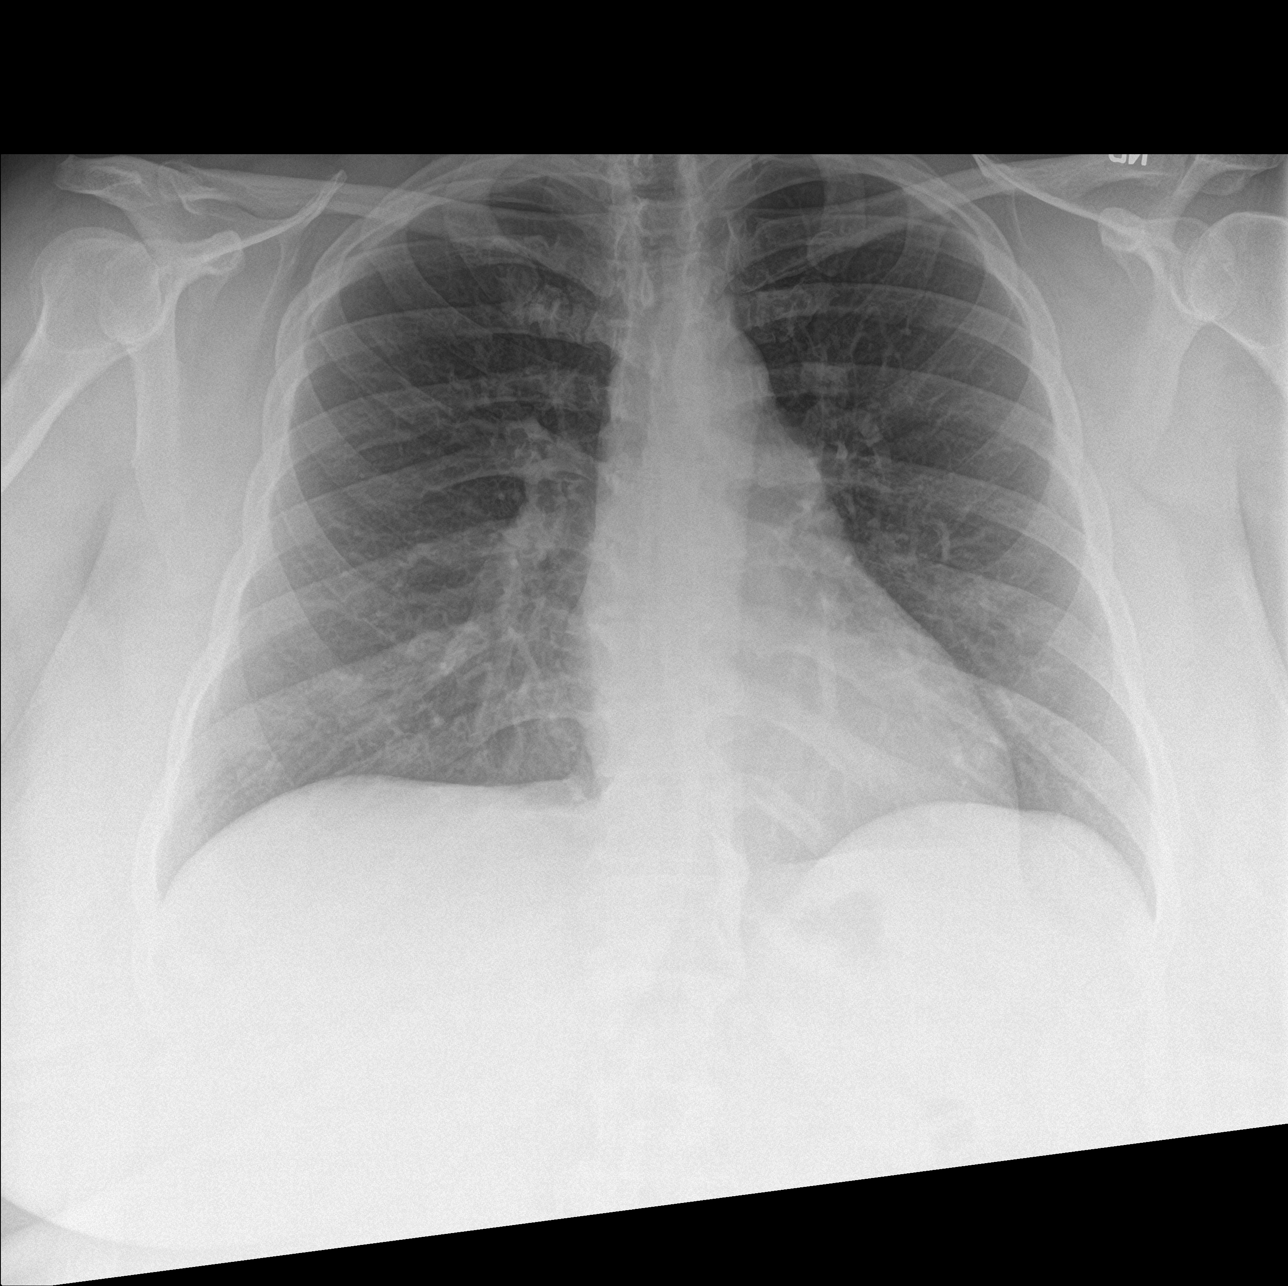

[Series 2: chest lat · 0.14mm/px · 2 of 2 slices shown]
[im 1/2]
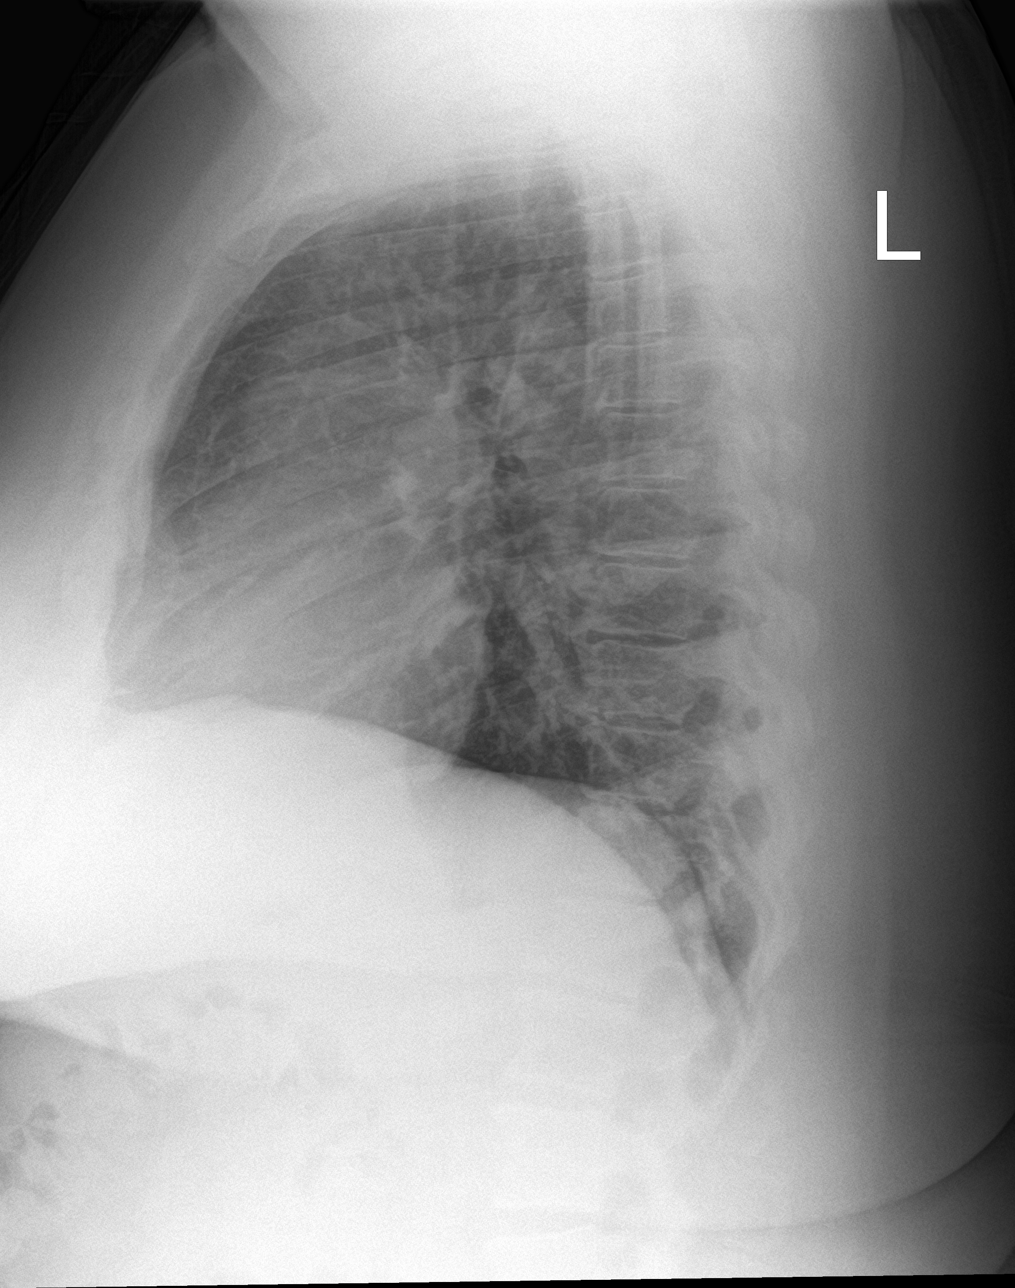
[im 2/2]
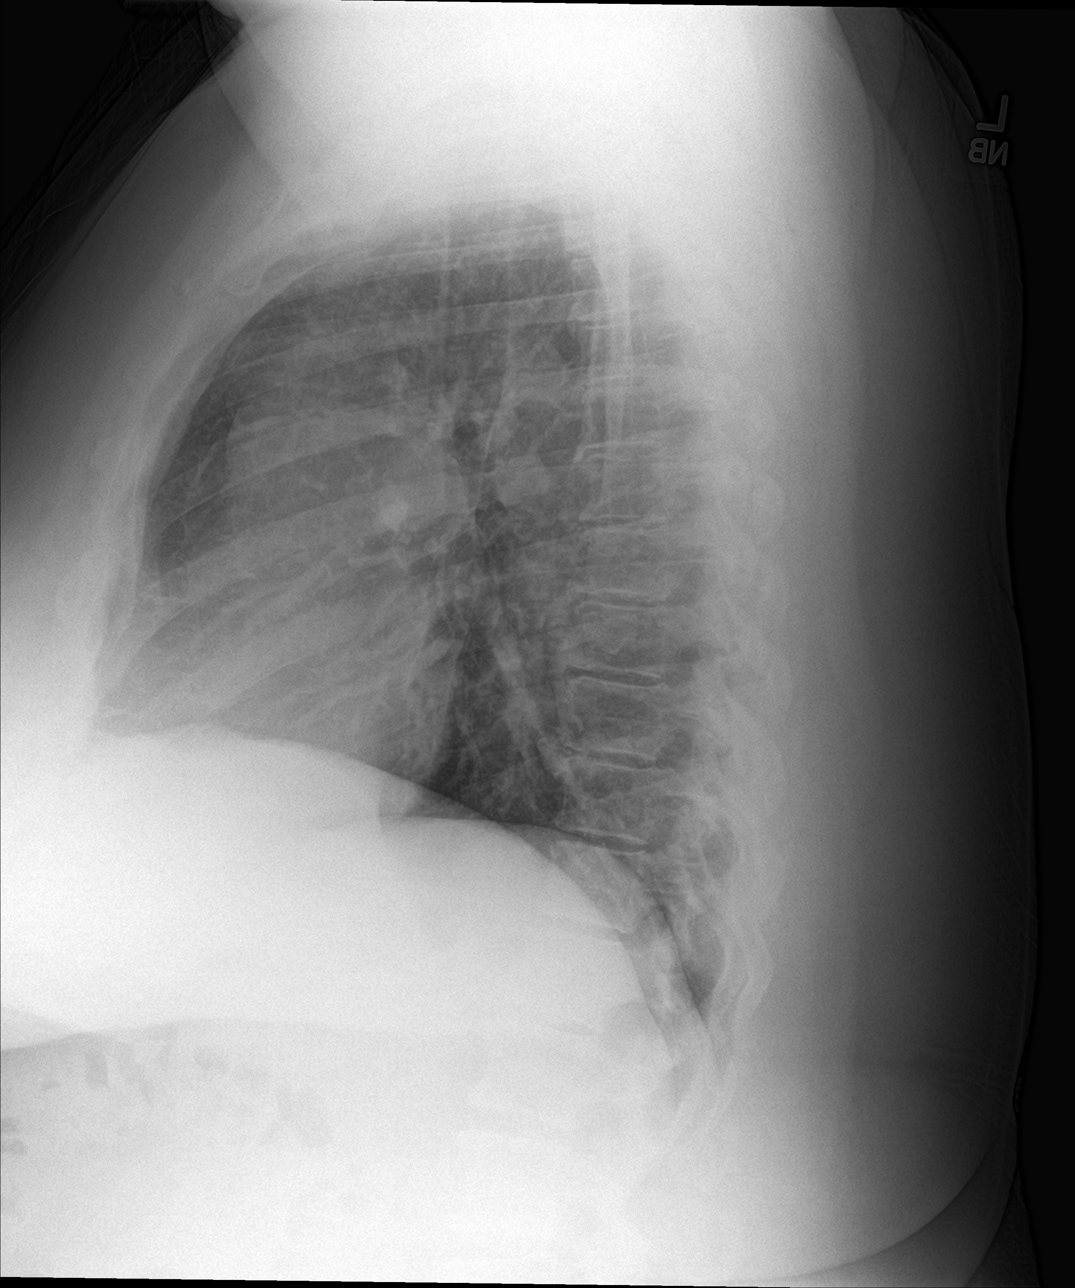

[3 of 3 positions shown; findings below may reference images not displayed]

FINDINGS: Lungs are adequately inflated and otherwise clear. Cardiomediastinal
silhouette is normal. Bony structures are within normal. Prominent
overlying soft tissues.
IMPRESSION: No active cardiopulmonary disease.

## 2023-02-24 ENCOUNTER — Other Ambulatory Visit: Payer: Self-pay

## 2023-02-24 ENCOUNTER — Other Ambulatory Visit (HOSPITAL_COMMUNITY): Payer: Self-pay

## 2023-03-15 ENCOUNTER — Other Ambulatory Visit: Payer: Self-pay

## 2023-03-15 ENCOUNTER — Other Ambulatory Visit (HOSPITAL_COMMUNITY): Payer: Self-pay

## 2023-04-04 ENCOUNTER — Other Ambulatory Visit (HOSPITAL_COMMUNITY): Payer: Self-pay

## 2023-04-09 ENCOUNTER — Other Ambulatory Visit (HOSPITAL_COMMUNITY): Payer: Self-pay

## 2023-05-20 ENCOUNTER — Other Ambulatory Visit (HOSPITAL_COMMUNITY): Payer: Self-pay

## 2023-06-09 ENCOUNTER — Other Ambulatory Visit (HOSPITAL_COMMUNITY): Payer: Self-pay

## 2023-06-23 DIAGNOSIS — M1612 Unilateral primary osteoarthritis, left hip: Secondary | ICD-10-CM | POA: Diagnosis not present

## 2023-06-23 DIAGNOSIS — M25552 Pain in left hip: Secondary | ICD-10-CM | POA: Insufficient documentation

## 2023-06-27 ENCOUNTER — Ambulatory Visit (INDEPENDENT_AMBULATORY_CARE_PROVIDER_SITE_OTHER): Payer: 59 | Admitting: Medical-Surgical

## 2023-06-27 ENCOUNTER — Encounter: Payer: Self-pay | Admitting: Medical-Surgical

## 2023-06-27 VITALS — BP 129/77 | HR 66 | Resp 20 | Ht 63.0 in | Wt 168.0 lb

## 2023-06-27 DIAGNOSIS — I10 Essential (primary) hypertension: Secondary | ICD-10-CM | POA: Diagnosis not present

## 2023-06-27 DIAGNOSIS — Z01818 Encounter for other preprocedural examination: Secondary | ICD-10-CM | POA: Diagnosis not present

## 2023-06-27 DIAGNOSIS — E119 Type 2 diabetes mellitus without complications: Secondary | ICD-10-CM

## 2023-06-27 DIAGNOSIS — G4733 Obstructive sleep apnea (adult) (pediatric): Secondary | ICD-10-CM

## 2023-06-27 LAB — POCT GLYCOSYLATED HEMOGLOBIN (HGB A1C)
HbA1c, POC (controlled diabetic range): 5.2 % (ref 0.0–7.0)
Hemoglobin A1C: 5.2 % (ref 4.0–5.6)

## 2023-06-27 LAB — POCT UA - MICROALBUMIN
Albumin/Creatinine Ratio, Urine, POC: <30
Creatinine, POC: 10 mg/dL
Microalbumin Ur, POC: 10 mg/L

## 2023-06-27 NOTE — Progress Notes (Signed)
        Established patient visit  History, exam, impression, and plan:  1. Essential (primary) hypertension (Primary) Very pleasant 48 year old female presenting today with a history of hypertension.  She is currently taking losartan  12.5 mg daily with hydrochlorothiazide  25 mg daily.  Tolerating both medications well without side effects.  Occasionally checks her blood pressure and notes her home readings are at or below goal.  Denies any concerning symptoms today.  Cardiovascular exam is normal.  Checking labs as below.  Blood pressure is at goal.  Continue losartan  and hydrochlorothiazide  as prescribed. - CBC - CMP14+EGFR - Lipid panel  2. OSA on CPAP She is currently using a CPAP as prescribed.  Gets her supplies from [employer] as this seems to be cheaper and more accessible than the DME company.  Denies any current needs at this time.  3. Controlled type 2 diabetes mellitus without complication, without long-term current use of insulin  (HCC) History of controlled type 2 diabetes.  Notes that she was on medications for approximately a month before she had her gastric bypass.  Since then, she has had a very controlled A1c's.  Her last 1 was 6 months ago at 5.3%.  Recheck today at 5.2%.  POCT microalbumin is normal.  Foot exam completed.  Up-to-date on other diabetic care.  Denies any concerns.  Not on medication at this time.  Continue diet/lifestyle modifications. - POCT HgB A1C - POCT UA - Microalbumin - HM Diabetes Foot Exam  4. Preoperative clearance She is scheduled to undergo a left hip arthroscopy in early August.  This will require preoperative clearance so we discussed today.  She is overall healthy with well-controlled blood pressure and diabetes.  Activity is greatly limited by hip pain however she is able to demonstrate 4 metabolic equivalents of cardiac capacity.  She is medically cleared for a low to moderate risk procedure.  Procedures performed this visit: None.  Return for  annual physical exam at your convenience.  __________________________________ Maryl Snook, DNP, APRN, FNP-BC Primary Care and Sports Medicine [facility] [place]

## 2023-06-28 ENCOUNTER — Ambulatory Visit: Payer: Self-pay | Admitting: Medical-Surgical

## 2023-06-28 LAB — CMP14+EGFR
ALT: 23 IU/L (ref 0–32)
AST: 27 IU/L (ref 0–40)
Albumin: 4.6 g/dL (ref 3.9–4.9)
Alkaline Phosphatase: 63 IU/L (ref 44–121)
BUN/Creatinine Ratio: 18 (ref 9–23)
BUN: 13 mg/dL (ref 6–24)
Bilirubin Total: <0.2 mg/dL (ref 0.0–1.2)
CO2: 23 mmol/L (ref 20–29)
Calcium: 9.3 mg/dL (ref 8.7–10.2)
Chloride: 104 mmol/L (ref 96–106)
Creatinine, Ser: 0.71 mg/dL (ref 0.57–1.00)
Globulin, Total: 2.7 g/dL (ref 1.5–4.5)
Glucose: 82 mg/dL (ref 70–99)
Potassium: 4.9 mmol/L (ref 3.5–5.2)
Sodium: 141 mmol/L (ref 134–144)
Total Protein: 7.3 g/dL (ref 6.0–8.5)
eGFR: 104 mL/min/{1.73_m2} (ref >59)

## 2023-06-28 LAB — CBC
Hematocrit: 40.5 % (ref 34.0–46.6)
Hemoglobin: 13.3 g/dL (ref 11.1–15.9)
MCH: 32.7 pg (ref 26.6–33.0)
MCHC: 32.8 g/dL (ref 31.5–35.7)
MCV: 100 fL — ABNORMAL HIGH (ref 79–97)
Platelets: 333 10*3/uL (ref 150–450)
RBC: 4.07 x10E6/uL (ref 3.77–5.28)
RDW: 12 % (ref 11.7–15.4)
WBC: 7.4 10*3/uL (ref 3.4–10.8)

## 2023-06-28 LAB — LIPID PANEL
Chol/HDL Ratio: 2.3 ratio (ref 0.0–4.4)
Cholesterol, Total: 169 mg/dL (ref 100–199)
HDL: 73 mg/dL (ref >39)
LDL Chol Calc (NIH): 79 mg/dL (ref 0–99)
Triglycerides: 92 mg/dL (ref 0–149)
VLDL Cholesterol Cal: 17 mg/dL (ref 5–40)

## 2023-06-29 ENCOUNTER — Other Ambulatory Visit (HOSPITAL_COMMUNITY): Payer: Self-pay

## 2023-08-09 ENCOUNTER — Ambulatory Visit (INDEPENDENT_AMBULATORY_CARE_PROVIDER_SITE_OTHER): Admitting: Medical-Surgical

## 2023-08-09 ENCOUNTER — Encounter: Payer: Self-pay | Admitting: Medical-Surgical

## 2023-08-09 VITALS — BP 119/74 | HR 53 | Resp 20 | Ht 63.0 in | Wt 168.3 lb

## 2023-08-09 DIAGNOSIS — I10 Essential (primary) hypertension: Secondary | ICD-10-CM

## 2023-08-09 DIAGNOSIS — Z124 Encounter for screening for malignant neoplasm of cervix: Secondary | ICD-10-CM | POA: Diagnosis not present

## 2023-08-09 DIAGNOSIS — Z0000 Encounter for general adult medical examination without abnormal findings: Secondary | ICD-10-CM

## 2023-08-09 DIAGNOSIS — Z23 Encounter for immunization: Secondary | ICD-10-CM

## 2023-08-09 NOTE — Progress Notes (Signed)
 Complete physical exam  Patient: Olivia Hodges   DOB: Mar 19, 1975   48 y.o. Female  MRN: [id number]  Subjective:    Chief Complaint  Patient presents with   Annual Exam    Olivia Hodges is a 48 y.o. female who presents today for a complete physical exam. She reports consuming a general diet. The patient does not participate in regular exercise at present. She generally feels well. She reports sleeping well. She does not have additional problems to discuss today.    Most recent fall risk assessment:    06/27/2023    3:48 PM  Fall Risk   Falls in the past year? 0  Number falls in past yr: 0  Injury with Fall? 0  Risk for fall due to : No Fall Risks  Follow up Falls evaluation completed     Most recent depression screenings:    06/27/2023    3:48 PM 06/22/2022    2:55 PM  PHQ 2/9 Scores  PHQ - 2 Score 0 0    Vision:Within last year and Dental: No current dental problems and Receives regular dental care    Patient Care Team: Willo Mini, NP as PCP - General (Nurse Practitioner)   Outpatient Medications Prior to Visit  Medication Sig   acetaminophen  (TYLENOL ) 500 MG tablet Take 1,000 mg by mouth every 6 (six) hours as needed for moderate pain or headache.   AMBULATORY NON FORMULARY MEDICATION Supply ordered: CPAP and other supplies needed (headgear, cushions, filters, heated tuubing and water chamber). Patient needs tubing and face mask!  Dx: obstructive sleep apnea Settings: auto-titration 5-20 cmH2O   calcium carbonate (OS-CAL) 1250 (500 Ca) MG chewable tablet Chew 1 tablet by mouth daily.   hydrochlorothiazide  (HYDRODIURIL ) 25 MG tablet Take 1 tablet (25 mg total) by mouth daily.   HYDROcodone -acetaminophen  (NORCO/VICODIN) 5-325 MG tablet Take 1 tablet by mouth every 8 (eight) hours as needed for moderate pain (pain score 4-6).   levocetirizine (XYZAL ) 5 MG tablet Take 1 tablet (5 mg total) by mouth every evening.   losartan  (COZAAR ) 25 MG tablet Take 0.5 tablets (12.5 mg  total) by mouth daily.   montelukast  (SINGULAIR ) 10 MG tablet Take 1 tablet (10 mg total) by mouth at bedtime.   Multiple Vitamin (MULTIVITAMIN) capsule Take 1 capsule by mouth daily.   nystatin  powder Apply liberally to the affected area(s) twice daily as directed   pantoprazole  (PROTONIX ) 40 MG tablet Take 1 tablet (40 mg total) by mouth daily.   tiZANidine  (ZANAFLEX ) 4 MG tablet Take 1 tablet (4 mg total) by mouth at bedtime.   No facility-administered medications prior to visit.    Review of Systems  Constitutional:  Negative for chills, fever, malaise/fatigue and weight loss.  HENT:  Negative for congestion, ear pain, hearing loss, sinus pain and sore throat.   Eyes:  Negative for blurred vision, photophobia and pain.  Respiratory:  Negative for cough, shortness of breath and wheezing.   Cardiovascular:  Negative for chest pain, palpitations and leg swelling.  Gastrointestinal:  Negative for abdominal pain, constipation, diarrhea, heartburn, nausea and vomiting.  Genitourinary:  Negative for dysuria, frequency and urgency.  Musculoskeletal:  Positive for joint pain. Negative for falls and neck pain.  Skin:  Negative for itching and rash.  Neurological:  Negative for dizziness, weakness and headaches.  Endo/Heme/Allergies:  Negative for polydipsia. Does not bruise/bleed easily.  Psychiatric/Behavioral:  Negative for depression, substance abuse and suicidal ideas. The patient is not nervous/anxious.  Objective:    BP 119/74 (BP Location: Right Arm, Cuff Size: Normal)   Pulse (!) 53   Resp 20   Ht 5' 3 (1.6 m)   Wt 168 lb 4.8 oz (76.3 kg)   SpO2 98%   BMI 29.81 kg/m    Physical Exam Vitals reviewed.  Constitutional:      General: She is not in acute distress.    Appearance: Normal appearance. She is not ill-appearing.  HENT:     Head: Normocephalic and atraumatic.     Right Ear: Tympanic membrane, ear canal and external ear normal. There is no impacted cerumen.      Left Ear: Tympanic membrane, ear canal and external ear normal. There is no impacted cerumen.     Nose: Nose normal. No congestion or rhinorrhea.     Mouth/Throat:     Mouth: Mucous membranes are moist.     Pharynx: No oropharyngeal exudate or posterior oropharyngeal erythema.  Eyes:     General: No scleral icterus.       Right eye: No discharge.        Left eye: No discharge.     Extraocular Movements: Extraocular movements intact.     Conjunctiva/sclera: Conjunctivae normal.     Pupils: Pupils are equal, round, and reactive to light.  Neck:     Thyroid: No thyromegaly.     Vascular: No carotid bruit or JVD.     Trachea: Trachea normal.  Cardiovascular:     Rate and Rhythm: Normal rate and regular rhythm.     Pulses: Normal pulses.     Heart sounds: Normal heart sounds. No murmur heard.    No friction rub. No gallop.  Pulmonary:     Effort: Pulmonary effort is normal. No respiratory distress.     Breath sounds: Normal breath sounds. No wheezing.  Abdominal:     General: Bowel sounds are normal. There is no distension.     Palpations: Abdomen is soft.     Tenderness: There is no abdominal tenderness. There is no guarding.  Musculoskeletal:        General: Normal range of motion.     Cervical back: Normal range of motion and neck supple.  Lymphadenopathy:     Cervical: No cervical adenopathy.  Skin:    General: Skin is warm and dry.  Neurological:     Mental Status: She is alert and oriented to person, place, and time.     Cranial Nerves: No cranial nerve deficit.  Psychiatric:        Mood and Affect: Mood normal.        Behavior: Behavior normal.        Thought Content: Thought content normal.        Judgment: Judgment normal.      No results found for any visits on 08/09/23.     Assessment & Plan:    Routine Health Maintenance and Physical Exam  Immunization History  Administered Date(s) Administered   Influenza-Unspecified 11/07/2018, 11/06/2020, 11/03/2021,  11/04/2022   PFIZER(Purple Top)SARS-COV-2 Vaccination 02/05/2019, 03/16/2019   PNEUMOCOCCAL CONJUGATE-20 08/09/2023   Pfizer(Comirnaty)Fall Seasonal Vaccine 12 years and older 12/27/2022   Tdap 05/25/2014, 03/16/2019    Health Maintenance  Topic Date Due   OPHTHALMOLOGY EXAM  Never done   HIV Screening  Never done   Hepatitis C Screening  Never done   Hepatitis B Vaccines (1 of 3 - 19+ 3-dose series) Never done   Cervical Cancer Screening (HPV/Pap Cotest)  Never  done   Zoster Vaccines- Shingrix (1 of 2) Never done   INFLUENZA VACCINE  08/26/2023   HEMOGLOBIN A1C  12/27/2023   Diabetic kidney evaluation - eGFR measurement  06/26/2024   Diabetic kidney evaluation - Urine ACR  06/26/2024   FOOT EXAM  06/26/2024   Fecal DNA (Cologuard)  08/03/2024   MAMMOGRAM  01/23/2025   DTaP/Tdap/Td (3 - Td or Tdap) 03/15/2029   Pneumococcal Vaccine 94-18 Years old  Completed   COVID-19 Vaccine  Completed   HPV VACCINES  Aged Out   Meningococcal B Vaccine  Aged Out    Discussed health benefits of physical activity, and encouraged her to engage in regular exercise appropriate for her age and condition.  1. Annual physical exam (Primary) Checking labs as below. UTD on preventative care. Wellness information provided with AVS.  2. Essential (primary) hypertension Stable and well controlled. Continue Losartan  as prescribed.   3. Cervical cancer screening Declined today.   4. Need for pneumococcal 20-valent conjugate vaccination Prevnar 20 given in office today.  - Pneumococcal conjugate vaccine 20-valent (Prevnar 20)  Return in about 1 year (around 08/08/2024) for annual physical exam or sooner if needed.     Twana Wileman, NP

## 2023-08-18 ENCOUNTER — Other Ambulatory Visit (HOSPITAL_COMMUNITY): Payer: Self-pay

## 2023-08-24 NOTE — Progress Notes (Addendum)
 Anesthesia Review:  PCP: Zada Palin clearance in 06/27/23 OV note  Cardiologist : none   PPM/ ICD: Device Orders: Rep Notified:  Chest x-ray : EKG : 08/30/23 Echo : Stress test: Cardiac Cath :   Activity level: can do a flight of stairs without difficulty  Sleep Study/ CPAP : has cpap  Fasting Blood Sugar :      / Checks Blood Sugar -- times a day:    Diabetes- pt states a preop prior to gastric bypass surgery she was labeled as diabetic in MD note.  Has never been on meds.  A month after gastric bypass no longer considered to be diabetic.  Hgba1c- have been normal since.  Does not check glucose at home.  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

## 2023-08-24 NOTE — H&P (Signed)
 TOTAL HIP ADMISSION H&P  Patient is admitted for left total hip arthroplasty.  Subjective:  Chief Complaint: Left hip pain  HPI: Olivia Hodges, 48 y.o. female, has a history of pain and functional disability in the left hip due to arthritis and patient has failed non-surgical conservative treatments for greater than 12 weeks to include NSAID's and/or analgesics, weight reduction as appropriate, and activity modification. Onset of symptoms was gradual, starting several years ago with gradually worsening course since that time. The patient noted no past surgery on the left hip. Patient currently rates pain in the left hip at 8 out of 10 with activity. Patient has worsening of pain with activity and weight bearing and pain that interfers with activities of daily living. Patient has evidence of subchondral cysts, periarticular osteophytes, and joint space narrowing by imaging studies. This condition presents safety issues increasing the risk of falls. There is no current active infection.  Patient Active Problem List   Diagnosis Date Noted   Pain in left hip 06/23/2023   Controlled type 2 diabetes mellitus without complication, without long-term current use of insulin  (HCC) 11/18/2020   Primary osteoarthritis of left hip 07/01/2020   Anterolisthesis of lumbar spine 07/01/2020   Tear of left acetabular labrum 07/01/2020   Degenerative disc disease, cervical 07/01/2020   Radiculopathy, lumbar region 08/02/2019   OSA on CPAP 06/29/2019   Essential (primary) hypertension 12/29/2018    Past Medical History:  Diagnosis Date   Degenerative disc disease, lumbar, cervical    Diabetes mellitus without complication (HCC)    High blood pressure    Osteoarthritis    Sleep apnea    Urticaria    (Pressure)    Past Surgical History:  Procedure Laterality Date   BIOPSY  11/07/2020   Procedure: BIOPSY;  Surgeon: Lyndel Deward PARAS, MD;  Location: [facility];  Service: General;;    ESOPHAGOGASTRODUODENOSCOPY N/A 11/07/2020   Procedure: ESOPHAGOGASTRODUODENOSCOPY (EGD);  Surgeon: Lyndel Deward PARAS, MD;  Location: [facility];  Service: General;  Laterality: N/A;    Prior to Admission medications   Medication Sig Start Date End Date Taking? Authorizing Provider  acetaminophen  (TYLENOL ) 500 MG tablet Take 1,000 mg by mouth every 6 (six) hours as needed for moderate pain or headache.    [provider]  AMBULATORY NON FORMULARY MEDICATION Supply ordered: CPAP and other supplies needed (headgear, cushions, filters, heated tuubing and water chamber). Patient needs tubing and face mask!  Dx: obstructive sleep apnea Settings: auto-titration 5-20 cmH2O 10/09/20   Alexander, Natalie, DO  calcium carbonate (OS-CAL) 1250 (500 Ca) MG chewable tablet Chew 1 tablet by mouth daily.    [provider]  hydrochlorothiazide  (HYDRODIURIL ) 25 MG tablet Take 1 tablet (25 mg total) by mouth daily. 12/27/22   Willo Mini, NP  HYDROcodone -acetaminophen  (NORCO/VICODIN) 5-325 MG tablet Take 1 tablet by mouth every 8 (eight) hours as needed for moderate pain (pain score 4-6). 12/27/22   Willo Mini, NP  levocetirizine (XYZAL ) 5 MG tablet Take 1 tablet (5 mg total) by mouth every evening. 12/27/22   Willo Mini, NP  losartan  (COZAAR ) 25 MG tablet Take 0.5 tablets (12.5 mg total) by mouth daily. 12/27/22   Willo Mini, NP  montelukast  (SINGULAIR ) 10 MG tablet Take 1 tablet (10 mg total) by mouth at bedtime. 12/27/22 12/27/23  Willo Mini, NP  Multiple Vitamin (MULTIVITAMIN) capsule Take 1 capsule by mouth daily.    [provider]  nystatin  powder Apply liberally to the affected area(s) twice daily as directed  06/22/22   Willo Mini, NP  pantoprazole  (PROTONIX ) 40 MG tablet Take 1 tablet (40 mg total) by mouth daily. 12/27/22   Willo Mini, NP  tiZANidine  (ZANAFLEX ) 4 MG tablet Take 1 tablet (4 mg total) by mouth at bedtime. 12/27/22   Willo Mini, NP    Allergies  Allergen  Reactions   Lisinopril Hives   Sulfa Antibiotics Hives    Social History   Socioeconomic History   Marital status: Married    Spouse name: Carole   Number of children: 0   Years of education: Not on file   Highest education level: Bachelor's degree (e.g., BA, AB, BS)  Occupational History   Occupation: Teacher, adult education: [employer]  Tobacco Use   Smoking status: Never   Smokeless tobacco: Never  Vaping Use   Vaping status: Never Used  Substance and Sexual Activity   Alcohol use: Never   Drug use: Never   Sexual activity: Yes    Partners: Female  Other Topics Concern   Not on file  Social History Narrative   Not on file   Social Drivers of Health   Financial Resource Strain: Low Risk  (08/08/2023)   Overall Financial Resource Strain (CARDIA)    Difficulty of Paying Living Expenses: Not hard at all  Food Insecurity: No Food Insecurity (08/08/2023)   Hunger Vital Sign    Worried About Running Out of Food in the Last Year: Never true    Ran Out of Food in the Last Year: Never true  Transportation Needs: No Transportation Needs (08/08/2023)   PRAPARE - Administrator, Civil Service (Medical): No    Lack of Transportation (Non-Medical): No  Physical Activity: Inactive (08/08/2023)   Exercise Vital Sign    Days of Exercise per Week: 0 days    Minutes of Exercise per Session: Not on file  Stress: No Stress Concern Present (08/08/2023)   [employer] of Occupational Health - Occupational Stress Questionnaire    Feeling of Stress: Not at all  Recent Concern: Stress - Stress Concern Present (06/22/2023)   [employer] of Occupational Health - Occupational Stress Questionnaire    Feeling of Stress : To some extent  Social Connections: Socially Isolated (08/08/2023)   Social Connection and Isolation Panel    Frequency of Communication with Friends and Family: Once a week    Frequency of Social Gatherings with Friends and Family: Once a week    Attends  Religious Services: Never    Database administrator or Organizations: No    Attends Engineer, structural: Not on file    Marital Status: Married  Catering manager Violence: Not At Risk (01/20/2021)   Humiliation, Afraid, Rape, and Kick questionnaire    Fear of Current or Ex-Partner: No    Emotionally Abused: No    Physically Abused: No    Sexually Abused: No    Tobacco Use: Low Risk  (08/09/2023)   Patient History    Smoking Tobacco Use: Never    Smokeless Tobacco Use: Never    Passive Exposure: Not on file   Social History   Substance and Sexual Activity  Alcohol Use Never    Family History  Problem Relation Age of Onset   High blood pressure Mother    Diabetes Mother    Breast cancer Mother 41   Kidney cancer Mother    Bladder Cancer Mother    Lymphoma Mother    High blood pressure Father  Prostate cancer Father    Breast cancer Sister 20       39   Lymphoma Sister    Diabetes Maternal Grandmother    High blood pressure Paternal Grandmother     ROS   Objective:  Physical Exam:  -Well-developed female, alert, oriented, no apparent distress.  - Right hip: Normal range of motion, no discomfort.  - Left hip: Obligatory external rotation, approximately 10-degree external rotation contracture, active flexion to approximately 80 degrees, no internal rotation, external rotation to approximately 5 degrees beyond contracture, abduction to approximately 5 degrees.  - Significant antalgic gait pattern on the left.  IMAGING: AP pelvis and lateral left hip demonstrate severe end-stage osteoarthritis of the left hip with bone-on-bone contact, massive osteophyte formation, and subchondral cysts. The right hip demonstrates minimal changes.  Assessment/Plan:  End stage arthritis, left hip  The patient history, physical examination, clinical judgement of the provider and imaging studies are consistent with end stage degenerative joint disease of the left hip and  total hip arthroplasty is deemed medically necessary. The treatment options including medical management, injection therapy, arthroscopy and arthroplasty were discussed at length. The risks and benefits of total hip arthroplasty were presented and reviewed. The risks due to aseptic loosening, infection, stiffness, dislocation/subluxation, thromboembolic complications and other imponderables were discussed. The patient acknowledged the explanation, agreed to proceed with the plan and consent was signed. Patient is being admitted for inpatient treatment for surgery, pain control, PT, OT, prophylactic antibiotics, VTE prophylaxis, progressive ambulation and ADLs and discharge planning.The patient is planning to be discharged home.   Patient's anticipated LOS is less than 2 midnights, meeting these requirements: - Younger than 75 - Lives within 1 hour of care - Has a competent adult at home to recover with post-op recover - NO history of  - Chronic pain requiring opiods  - Coronary Artery Disease  - Heart failure  - Heart attack  - Stroke  - DVT/VTE  - Cardiac arrhythmia  - Respiratory Failure/COPD  - Renal failure  - Anemia  - Advanced Liver disease  Therapy Plans: HEP Disposition: Home with wife Planned DVT Prophylaxis: Aspirin 81mg  BID  DME Needed: Vannie ordered PCP: Zada Palin, NP (clearance in 06/27/23 OV note) TXA: IV Allergies: lisinopril, sulfa (angioedema) Anesthesia Concerns: OSA on CPAP BMI: 30.9 Last HgbA1c: 5.2% on 06/27/23 Pharmacy: [facility], deliver to room  Other: -Hx gastric bypass -Requests SDD -Will stop tizanidine  post-op and use methocarbamol post-op  - Patient was instructed on what medications to stop prior to surgery. - Follow-up visit in 2 weeks with Dr. Melodi - Begin physical therapy following surgery - Pre-operative lab work as pre-surgical testing - Prescriptions will be provided in hospital at time of discharge  Corean Sender, PA-C Orthopedic  Surgery [facility] [place]

## 2023-08-25 NOTE — Patient Instructions (Addendum)
 SURGICAL WAITING ROOM VISITATION  Patients having surgery or a procedure may have no more than 2 support people in the waiting area - these visitors may rotate.    Children under the age of 63 must have an adult with them who is not the patient.  Visitors with respiratory illnesses are discouraged from visiting and should remain at home.  If the patient needs to stay at the hospital during part of their recovery, the visitor guidelines for inpatient rooms apply. Pre-op nurse will coordinate an appropriate time for 1 support person to accompany patient in pre-op.  This support person may not rotate.    Please refer to the [facility] website for the visitor guidelines for Inpatients (after your surgery is over and you are in a regular room).       Your procedure is scheduled on:  09/12/2023    Report to [facility] Main Entrance    Report to admitting at     1115AM   Call this number if you have problems the morning of surgery [phone]   Do not eat food :After Midnight.   After Midnight you may have the following liquids until __ 1045____ AM  DAY OF SURGERY  Water Non-Citrus Juices (without pulp, NO RED-Apple, White grape, White cranberry) Black Coffee (NO MILK/CREAM OR CREAMERS, sugar ok)  Clear Tea (NO MILK/CREAM OR CREAMERS, sugar ok) regular and decaf                             Plain Jell-O (NO RED)                                           Fruit ices (not with fruit pulp, NO RED)                                     Popsicles (NO RED)                                                               Sports drinks like Gatorade (NO RED)                   The day of surgery:  Drink ONE (1) Pre-Surgery Clear Ensure or G2 at 1045 AM the morning of surgery. Drink in one sitting. Do not sip.  This drink was given to you during your hospital  pre-op appointment visit. Nothing else to drink after completing the  Pre-Surgery Clear Ensure or G2.          If you have  questions, please contact your surgeon's office.   FOLLOW BOWEL PREP AND ANY ADDITIONAL PRE OP INSTRUCTIONS YOU RECEIVED FROM YOUR SURGEON'S OFFICE!!!     Oral Hygiene is also important to reduce your risk of infection.                                    Remember - BRUSH YOUR TEETH THE MORNING OF SURGERY WITH YOUR REGULAR  TOOTHPASTE  DENTURES WILL BE REMOVED PRIOR TO SURGERY PLEASE DO NOT APPLY Poly grip OR ADHESIVES!!!   Do NOT smoke after Midnight   Stop all vitamins and herbal supplements 7 days before surgery.   Take these medicines the morning of surgery with A SIP OF WATER:  none  DO NOT TAKE ANY ORAL DIABETIC MEDICATIONS DAY OF YOUR SURGERY  Bring CPAP mask and tubing day of surgery.                              You may not have any metal on your body including hair pins, jewelry, and body piercing             Do not wear make-up, lotions, powders, perfumes/cologne, or deodorant  Do not wear nail polish including gel and S&S, artificial/acrylic nails, or any other type of covering on natural nails including finger and toenails. If you have artificial nails, gel coating, etc. that needs to be removed by a nail salon please have this removed prior to surgery or surgery may need to be canceled/ delayed if the surgeon/ anesthesia feels like they are unable to be safely monitored.   Do not shave  48 hours prior to surgery.               Men may shave face and neck.   Do not bring valuables to the hospital. [facility] IS NOT             RESPONSIBLE   FOR VALUABLES.   Contacts, glasses, dentures or bridgework may not be worn into surgery.   Bring small overnight bag day of surgery.   DO NOT BRING YOUR HOME MEDICATIONS TO THE HOSPITAL. PHARMACY WILL DISPENSE MEDICATIONS LISTED ON YOUR MEDICATION LIST TO YOU DURING YOUR ADMISSION IN THE HOSPITAL!    Patients discharged on the day of surgery will not be allowed to drive home.  Someone NEEDS to stay with you for the first 24  hours after anesthesia.   Special Instructions: Bring a copy of your healthcare power of attorney and living will documents the day of surgery if you haven't scanned them before.              Please read over the following fact sheets you were given: IF YOU HAVE QUESTIONS ABOUT YOUR PRE-OP INSTRUCTIONS PLEASE CALL [phone].   If you received a COVID test during your pre-op visit  it is requested that you wear a mask when out in public, stay away from anyone that may not be feeling well and notify your surgeon if you develop symptoms. If you test positive for Covid or have been in contact with anyone that has tested positive in the last 10 days please notify you surgeon.      Pre-operative 5 CHG Bath Instructions   You can play a key role in reducing the risk of infection after surgery. Your skin needs to be as free of germs as possible. You can reduce the number of germs on your skin by washing with CHG (chlorhexidine  gluconate) soap before surgery. CHG is an antiseptic soap that kills germs and continues to kill germs even after washing.   DO NOT use if you have an allergy to chlorhexidine /CHG or antibacterial soaps. If your skin becomes reddened or irritated, stop using the CHG and notify one of our RNs at [phone].   Please shower with the CHG soap starting 4 days before  surgery using the following schedule:     Please keep in mind the following:  DO NOT shave, including legs and underarms, starting the day of your first shower.   You may shave your face at any point before/day of surgery.  Place clean sheets on your bed the day you start using CHG soap. Use a clean washcloth (not used since being washed) for each shower. DO NOT sleep with pets once you start using the CHG.   CHG Shower Instructions:  If you choose to wash your hair and private area, wash first with your normal shampoo/soap.  After you use shampoo/soap, rinse your hair and body thoroughly to remove shampoo/soap  residue.  Turn the water OFF and apply about 3 tablespoons (45 ml) of CHG soap to a CLEAN washcloth.  Apply CHG soap ONLY FROM YOUR NECK DOWN TO YOUR TOES (washing for 3-5 minutes)  DO NOT use CHG soap on face, private areas, open wounds, or sores.  Pay special attention to the area where your surgery is being performed.  If you are having back surgery, having someone wash your back for you may be helpful. Wait 2 minutes after CHG soap is applied, then you may rinse off the CHG soap.  Pat dry with a clean towel  Put on clean clothes/pajamas   If you choose to wear lotion, please use ONLY the CHG-compatible lotions on the back of this paper.     Additional instructions for the day of surgery: DO NOT APPLY any lotions, deodorants, cologne, or perfumes.   Put on clean/comfortable clothes.  Brush your teeth.  Ask your nurse before applying any prescription medications to the skin.      CHG Compatible Lotions   Aveeno Moisturizing lotion  Cetaphil Moisturizing Cream  Cetaphil Moisturizing Lotion  Clairol Herbal Essence Moisturizing Lotion, Dry Skin  Clairol Herbal Essence Moisturizing Lotion, Extra Dry Skin  Clairol Herbal Essence Moisturizing Lotion, Normal Skin  Curel Age Defying Therapeutic Moisturizing Lotion with Alpha Hydroxy  Curel Extreme Care Body Lotion  Curel Soothing Hands Moisturizing Hand Lotion  Curel Therapeutic Moisturizing Cream, Fragrance-Free  Curel Therapeutic Moisturizing Lotion, Fragrance-Free  Curel Therapeutic Moisturizing Lotion, Original Formula  Eucerin Daily Replenishing Lotion  Eucerin Dry Skin Therapy Plus Alpha Hydroxy Crme  Eucerin Dry Skin Therapy Plus Alpha Hydroxy Lotion  Eucerin Original Crme  Eucerin Original Lotion  Eucerin Plus Crme Eucerin Plus Lotion  Eucerin TriLipid Replenishing Lotion  Keri Anti-Bacterial Hand Lotion  Keri Deep Conditioning Original Lotion Dry Skin Formula Softly Scented  Keri Deep Conditioning Original Lotion,  Fragrance Free Sensitive Skin Formula  Keri Lotion Fast Absorbing Fragrance Free Sensitive Skin Formula  Keri Lotion Fast Absorbing Softly Scented Dry Skin Formula  Keri Original Lotion  Keri Skin Renewal Lotion Keri Silky Smooth Lotion  Keri Silky Smooth Sensitive Skin Lotion  Nivea Body Creamy Conditioning Oil  Nivea Body Extra Enriched Teacher, adult education Moisturizing Lotion Nivea Crme  Nivea Skin Firming Lotion  NutraDerm 30 Skin Lotion  NutraDerm Skin Lotion  NutraDerm Therapeutic Skin Cream  NutraDerm Therapeutic Skin Lotion  ProShield Protective Hand Cream  Provon moisturizing lotion

## 2023-08-30 ENCOUNTER — Encounter (HOSPITAL_COMMUNITY): Payer: Self-pay

## 2023-08-30 ENCOUNTER — Encounter (HOSPITAL_COMMUNITY)
Admission: RE | Admit: 2023-08-30 | Discharge: 2023-08-30 | Disposition: A | Discharge status: Home / Self Care | Source: Ambulatory Visit | Attending: Orthopedic Surgery

## 2023-08-30 ENCOUNTER — Other Ambulatory Visit: Payer: Self-pay

## 2023-08-30 VITALS — BP 128/72 | HR 64 | Temp 98.9°F | Resp 16 | Ht 63.0 in | Wt 167.5 lb

## 2023-08-30 DIAGNOSIS — Z01818 Encounter for other preprocedural examination: Secondary | ICD-10-CM | POA: Insufficient documentation

## 2023-08-30 LAB — CBC
HCT: 41.4 % (ref 36.0–46.0)
Hemoglobin: 13.4 g/dL (ref 12.0–15.0)
MCH: 31.6 pg (ref 26.0–34.0)
MCHC: 32.4 g/dL (ref 30.0–36.0)
MCV: 97.6 fL (ref 80.0–100.0)
Platelets: 352 K/uL (ref 150–400)
RBC: 4.24 MIL/uL (ref 3.87–5.11)
RDW: 12.3 % (ref 11.5–15.5)
WBC: 9.1 K/uL (ref 4.0–10.5)
nRBC: 0 % (ref 0.0–0.2)

## 2023-08-30 LAB — BASIC METABOLIC PANEL WITH GFR
Anion gap: 9 (ref 5–15)
BUN: 19 mg/dL (ref 6–20)
CO2: 27 mmol/L (ref 22–32)
Calcium: 9.9 mg/dL (ref 8.9–10.3)
Chloride: 101 mmol/L (ref 98–111)
Creatinine, Ser: 0.67 mg/dL (ref 0.44–1.00)
GFR, Estimated: >60 mL/min (ref >60)
Glucose, Bld: 100 mg/dL — ABNORMAL HIGH (ref 70–99)
Potassium: 4.5 mmol/L (ref 3.5–5.1)
Sodium: 137 mmol/L (ref 135–145)

## 2023-08-30 LAB — TYPE AND SCREEN
ABO/RH(D): A POS
Antibody Screen: NEGATIVE

## 2023-08-30 LAB — SURGICAL PCR SCREEN
MRSA, PCR: NEGATIVE
Staphylococcus aureus: NEGATIVE

## 2023-08-31 LAB — HEMOGLOBIN A1C
Hgb A1c MFr Bld: 5.3 % (ref 4.8–5.6)
Mean Plasma Glucose: 105 mg/dL

## 2023-09-07 ENCOUNTER — Other Ambulatory Visit (HOSPITAL_COMMUNITY): Payer: Self-pay

## 2023-09-12 ENCOUNTER — Other Ambulatory Visit: Payer: Self-pay

## 2023-09-12 ENCOUNTER — Ambulatory Visit (HOSPITAL_BASED_OUTPATIENT_CLINIC_OR_DEPARTMENT_OTHER): Admitting: Anesthesiology

## 2023-09-12 ENCOUNTER — Ambulatory Visit (HOSPITAL_COMMUNITY): Payer: Self-pay | Admitting: Medical

## 2023-09-12 ENCOUNTER — Ambulatory Visit (HOSPITAL_COMMUNITY)

## 2023-09-12 ENCOUNTER — Encounter (HOSPITAL_COMMUNITY): Payer: Self-pay | Admitting: Orthopedic Surgery

## 2023-09-12 ENCOUNTER — Encounter (HOSPITAL_COMMUNITY)
Admission: RE | Disposition: A | Discharge status: Home / Self Care | Payer: Self-pay | Source: Home / Self Care | Attending: Orthopedic Surgery

## 2023-09-12 ENCOUNTER — Other Ambulatory Visit (HOSPITAL_COMMUNITY): Payer: Self-pay

## 2023-09-12 ENCOUNTER — Ambulatory Visit (HOSPITAL_COMMUNITY)
Admission: RE | Admit: 2023-09-12 | Discharge: 2023-09-12 | Disposition: A | Discharge status: Home / Self Care | Attending: Orthopedic Surgery | Admitting: Orthopedic Surgery

## 2023-09-12 DIAGNOSIS — T797XXA Traumatic subcutaneous emphysema, initial encounter: Secondary | ICD-10-CM | POA: Diagnosis not present

## 2023-09-12 DIAGNOSIS — E119 Type 2 diabetes mellitus without complications: Secondary | ICD-10-CM | POA: Insufficient documentation

## 2023-09-12 DIAGNOSIS — M1612 Unilateral primary osteoarthritis, left hip: Secondary | ICD-10-CM

## 2023-09-12 DIAGNOSIS — I10 Essential (primary) hypertension: Secondary | ICD-10-CM | POA: Diagnosis not present

## 2023-09-12 DIAGNOSIS — G4733 Obstructive sleep apnea (adult) (pediatric): Secondary | ICD-10-CM | POA: Diagnosis not present

## 2023-09-12 DIAGNOSIS — Z01818 Encounter for other preprocedural examination: Secondary | ICD-10-CM

## 2023-09-12 DIAGNOSIS — M25752 Osteophyte, left hip: Secondary | ICD-10-CM | POA: Diagnosis not present

## 2023-09-12 DIAGNOSIS — Z471 Aftercare following joint replacement surgery: Secondary | ICD-10-CM | POA: Diagnosis not present

## 2023-09-12 DIAGNOSIS — M47816 Spondylosis without myelopathy or radiculopathy, lumbar region: Secondary | ICD-10-CM | POA: Diagnosis not present

## 2023-09-12 DIAGNOSIS — Z96642 Presence of left artificial hip joint: Secondary | ICD-10-CM | POA: Diagnosis not present

## 2023-09-12 HISTORY — PX: TOTAL HIP ARTHROPLASTY: SHX124

## 2023-09-12 LAB — POCT PREGNANCY, URINE: Preg Test, Ur: NEGATIVE

## 2023-09-12 SURGERY — ARTHROPLASTY, HIP, TOTAL, ANTERIOR APPROACH
Anesthesia: General | Site: Hip | Laterality: Left

## 2023-09-12 MED ORDER — PROPOFOL 1000 MG/100ML IV EMUL
INTRAVENOUS | Status: AC
  Filled 2023-09-12: qty 100

## 2023-09-12 MED ORDER — HYDROMORPHONE HCL 2 MG/ML IJ SOLN
INTRAMUSCULAR | Status: AC
  Filled 2023-09-12: qty 1

## 2023-09-12 MED ORDER — BUPIVACAINE-EPINEPHRINE (PF) 0.25% -1:200000 IJ SOLN
INTRAMUSCULAR | Status: AC
Start: 2023-09-12 — End: 2023-09-12
  Filled 2023-09-12: qty 30

## 2023-09-12 MED ORDER — POVIDONE-IODINE 10 % EX SWAB
2.0000 | Freq: Once | CUTANEOUS | Status: AC
  Administered 2023-09-12: 2 via TOPICAL

## 2023-09-12 MED ORDER — ROCURONIUM BROMIDE 10 MG/ML (PF) SYRINGE
PREFILLED_SYRINGE | INTRAVENOUS | Status: AC
  Filled 2023-09-12: qty 10

## 2023-09-12 MED ORDER — KETAMINE HCL 50 MG/5ML IJ SOSY
PREFILLED_SYRINGE | INTRAMUSCULAR | Status: AC
  Filled 2023-09-12: qty 5

## 2023-09-12 MED ORDER — ORAL CARE MOUTH RINSE: 15.0000 mL | Freq: Once | OROMUCOSAL | Status: AC

## 2023-09-12 MED ORDER — TRANEXAMIC ACID-NACL 1000-0.7 MG/100ML-% IV SOLN
1000.0000 mg | INTRAVENOUS | Status: AC
  Administered 2023-09-12: 1000 mg via INTRAVENOUS
  Filled 2023-09-12: qty 100

## 2023-09-12 MED ORDER — CHLORHEXIDINE GLUCONATE 0.12 % MT SOLN
15.0000 mL | Freq: Once | OROMUCOSAL | Status: AC
  Administered 2023-09-12: 15 mL via OROMUCOSAL

## 2023-09-12 MED ORDER — DEXAMETHASONE SODIUM PHOSPHATE 10 MG/ML IJ SOLN
INTRAMUSCULAR | Status: DC | PRN
  Administered 2023-09-12: 10 mg via INTRAVENOUS

## 2023-09-12 MED ORDER — LACTATED RINGERS IV BOLUS: 250.0000 mL | Freq: Once | INTRAVENOUS | Status: DC

## 2023-09-12 MED ORDER — FENTANYL CITRATE PF 50 MCG/ML IJ SOSY
PREFILLED_SYRINGE | INTRAMUSCULAR | Status: AC
  Filled 2023-09-12: qty 2

## 2023-09-12 MED ORDER — MIDAZOLAM HCL 2 MG/2ML IJ SOLN
INTRAMUSCULAR | Status: DC | PRN
  Administered 2023-09-12: 2 mg via INTRAVENOUS

## 2023-09-12 MED ORDER — CEFAZOLIN SODIUM-DEXTROSE 2-4 GM/100ML-% IV SOLN: 2.0000 g | Freq: Four times a day (QID) | INTRAVENOUS | Status: DC

## 2023-09-12 MED ORDER — MIDAZOLAM HCL 2 MG/2ML IJ SOLN
INTRAMUSCULAR | Status: AC
  Filled 2023-09-12: qty 2

## 2023-09-12 MED ORDER — LACTATED RINGERS IV SOLN: INTRAVENOUS | Status: DC

## 2023-09-12 MED ORDER — FENTANYL CITRATE PF 50 MCG/ML IJ SOSY
25.0000 ug | PREFILLED_SYRINGE | INTRAMUSCULAR | Status: DC | PRN
  Administered 2023-09-12 (×3): 50 ug via INTRAVENOUS

## 2023-09-12 MED ORDER — ROCURONIUM BROMIDE 100 MG/10ML IV SOLN
INTRAVENOUS | Status: DC | PRN
  Administered 2023-09-12: 40 mg via INTRAVENOUS
  Administered 2023-09-12 (×2): 10 mg via INTRAVENOUS

## 2023-09-12 MED ORDER — ONDANSETRON HCL 4 MG/2ML IJ SOLN
INTRAMUSCULAR | Status: AC
  Filled 2023-09-12: qty 2

## 2023-09-12 MED ORDER — KETAMINE HCL 50 MG/5ML IJ SOSY
PREFILLED_SYRINGE | INTRAMUSCULAR | Status: DC | PRN
  Administered 2023-09-12: 50 mg via INTRAVENOUS

## 2023-09-12 MED ORDER — BUPIVACAINE-EPINEPHRINE (PF) 0.25% -1:200000 IJ SOLN
INTRAMUSCULAR | Status: DC | PRN
  Administered 2023-09-12: 30 mL

## 2023-09-12 MED ORDER — FENTANYL CITRATE (PF) 100 MCG/2ML IJ SOLN
INTRAMUSCULAR | Status: AC
  Filled 2023-09-12: qty 2

## 2023-09-12 MED ORDER — DEXAMETHASONE SODIUM PHOSPHATE 10 MG/ML IJ SOLN: 8.0000 mg | Freq: Once | INTRAMUSCULAR | Status: DC

## 2023-09-12 MED ORDER — RIVAROXABAN 10 MG PO TABS
10.0000 mg | ORAL_TABLET | Freq: Every day | ORAL | 0 refills | Status: AC
  Filled 2023-09-12 (×2): qty 21, 21d supply, fill #0

## 2023-09-12 MED ORDER — SUGAMMADEX SODIUM 200 MG/2ML IV SOLN
INTRAVENOUS | Status: DC | PRN
  Administered 2023-09-12: 150 mg via INTRAVENOUS

## 2023-09-12 MED ORDER — METHOCARBAMOL 500 MG PO TABS
500.0000 mg | ORAL_TABLET | Freq: Four times a day (QID) | ORAL | Status: DC | PRN
Start: 2023-09-12 — End: 2023-09-12

## 2023-09-12 MED ORDER — PROPOFOL 500 MG/50ML IV EMUL
INTRAVENOUS | Status: AC
  Filled 2023-09-12: qty 50

## 2023-09-12 MED ORDER — FENTANYL CITRATE (PF) 100 MCG/2ML IJ SOLN
INTRAMUSCULAR | Status: DC | PRN
  Administered 2023-09-12: 100 ug via INTRAVENOUS
  Administered 2023-09-12 (×2): 50 ug via INTRAVENOUS

## 2023-09-12 MED ORDER — CEFAZOLIN SODIUM-DEXTROSE 2-4 GM/100ML-% IV SOLN
2.0000 g | INTRAVENOUS | Status: AC
  Administered 2023-09-12: 2 g via INTRAVENOUS
  Filled 2023-09-12: qty 100

## 2023-09-12 MED ORDER — SUGAMMADEX SODIUM 200 MG/2ML IV SOLN
INTRAVENOUS | Status: AC
  Filled 2023-09-12: qty 2

## 2023-09-12 MED ORDER — AMISULPRIDE (ANTIEMETIC) 5 MG/2ML IV SOLN
10.0000 mg | Freq: Once | INTRAVENOUS | Status: AC | PRN
  Administered 2023-09-12: 10 mg via INTRAVENOUS

## 2023-09-12 MED ORDER — DEXAMETHASONE SODIUM PHOSPHATE 10 MG/ML IJ SOLN
INTRAMUSCULAR | Status: AC
  Filled 2023-09-12: qty 1

## 2023-09-12 MED ORDER — OXYCODONE HCL 5 MG/5ML PO SOLN: 5.0000 mg | Freq: Once | ORAL | Status: DC | PRN

## 2023-09-12 MED ORDER — LACTATED RINGERS IV BOLUS
500.0000 mL | Freq: Once | INTRAVENOUS | Status: AC
  Administered 2023-09-12: 500 mL via INTRAVENOUS

## 2023-09-12 MED ORDER — PROPOFOL 10 MG/ML IV BOLUS
INTRAVENOUS | Status: AC
  Filled 2023-09-12: qty 20

## 2023-09-12 MED ORDER — SODIUM CHLORIDE (PF) 0.9 % IJ SOLN
INTRAMUSCULAR | Status: AC
  Filled 2023-09-12: qty 10

## 2023-09-12 MED ORDER — AMISULPRIDE (ANTIEMETIC) 5 MG/2ML IV SOLN
INTRAVENOUS | Status: AC
  Filled 2023-09-12: qty 4

## 2023-09-12 MED ORDER — METHOCARBAMOL 1000 MG/10ML IJ SOLN
INTRAMUSCULAR | Status: AC
  Filled 2023-09-12: qty 10

## 2023-09-12 MED ORDER — WATER FOR IRRIGATION, STERILE IR SOLN
Status: DC | PRN
  Administered 2023-09-12: 2000 mL

## 2023-09-12 MED ORDER — 0.9 % SODIUM CHLORIDE (POUR BTL) OPTIME
TOPICAL | Status: DC | PRN
  Administered 2023-09-12: 1000 mL

## 2023-09-12 MED ORDER — FENTANYL CITRATE PF 50 MCG/ML IJ SOSY
PREFILLED_SYRINGE | INTRAMUSCULAR | Status: AC
  Filled 2023-09-12: qty 1

## 2023-09-12 MED ORDER — ACETAMINOPHEN 10 MG/ML IV SOLN
1000.0000 mg | Freq: Four times a day (QID) | INTRAVENOUS | Status: DC
  Administered 2023-09-12: 1000 mg via INTRAVENOUS
  Filled 2023-09-12: qty 100

## 2023-09-12 MED ORDER — HYDROMORPHONE HCL 1 MG/ML IJ SOLN
INTRAMUSCULAR | Status: DC | PRN
  Administered 2023-09-12: .8 mg via INTRAVENOUS
  Administered 2023-09-12: .4 mg via INTRAVENOUS
  Administered 2023-09-12: .8 mg via INTRAVENOUS

## 2023-09-12 MED ORDER — METHOCARBAMOL 500 MG PO TABS
500.0000 mg | ORAL_TABLET | Freq: Four times a day (QID) | ORAL | 0 refills | Status: DC | PRN
  Filled 2023-09-12 (×2): qty 40, 10d supply, fill #0

## 2023-09-12 MED ORDER — OXYCODONE HCL 5 MG PO TABS: 5.0000 mg | ORAL_TABLET | Freq: Once | ORAL | Status: DC | PRN

## 2023-09-12 MED ORDER — PROPOFOL 10 MG/ML IV BOLUS
INTRAVENOUS | Status: DC | PRN
  Administered 2023-09-12: 200 mg via INTRAVENOUS

## 2023-09-12 MED ORDER — OXYCODONE HCL 5 MG PO TABS
5.0000 mg | ORAL_TABLET | Freq: Four times a day (QID) | ORAL | 0 refills | Status: AC | PRN
  Filled 2023-09-12 (×2): qty 42, 6d supply, fill #0

## 2023-09-12 MED ORDER — LIDOCAINE HCL (PF) 2 % IJ SOLN
INTRAMUSCULAR | Status: AC
  Filled 2023-09-12: qty 5

## 2023-09-12 MED ORDER — ONDANSETRON HCL 4 MG/2ML IJ SOLN
INTRAMUSCULAR | Status: DC | PRN
  Administered 2023-09-12: 4 mg via INTRAVENOUS

## 2023-09-12 MED ORDER — LIDOCAINE HCL (CARDIAC) PF 100 MG/5ML IV SOSY
PREFILLED_SYRINGE | INTRAVENOUS | Status: DC | PRN
  Administered 2023-09-12: 100 mg via INTRAVENOUS

## 2023-09-12 MED ORDER — METHOCARBAMOL 1000 MG/10ML IJ SOLN
500.0000 mg | Freq: Four times a day (QID) | INTRAMUSCULAR | Status: DC | PRN
  Administered 2023-09-12: 500 mg via INTRAVENOUS

## 2023-09-12 MED ORDER — TRAMADOL HCL 50 MG PO TABS
50.0000 mg | ORAL_TABLET | Freq: Four times a day (QID) | ORAL | 0 refills | Status: AC | PRN
  Filled 2023-09-12 (×2): qty 40, 5d supply, fill #0

## 2023-09-12 MED ORDER — LACTATED RINGERS IV SOLN
INTRAVENOUS | Status: DC
Start: 2023-09-12 — End: 2023-09-12

## 2023-09-12 MED ORDER — ONDANSETRON HCL 4 MG PO TABS
4.0000 mg | ORAL_TABLET | Freq: Four times a day (QID) | ORAL | 0 refills | Status: AC | PRN
  Filled 2023-09-12 (×2): qty 30, 8d supply, fill #0

## 2023-09-12 MED ORDER — TRANEXAMIC ACID 650 MG PO TABS: 1300.0000 mg | ORAL_TABLET | Freq: Once | ORAL | Status: DC

## 2023-09-12 MED ORDER — PROPOFOL 500 MG/50ML IV EMUL
INTRAVENOUS | Status: DC | PRN
  Administered 2023-09-12: 125 ug/kg/min via INTRAVENOUS

## 2023-09-12 SURGICAL SUPPLY — 35 items
BAG COUNTER SPONGE SURGICOUNT (BAG) IMPLANT
BAG ZIPLOCK 12X15 (MISCELLANEOUS) IMPLANT
BALL HIP CERAMIC 32MM PLUS 9 IMPLANT
BLADE SAG 18X100X1.27 (BLADE) ×1 IMPLANT
COVER PERINEAL POST (MISCELLANEOUS) ×1 IMPLANT
COVER SURGICAL LIGHT HANDLE (MISCELLANEOUS) ×1 IMPLANT
CUP ACETBLR 52 OD PINNACLE (Hips) IMPLANT
DERMABOND ADVANCED .7 DNX12 (GAUZE/BANDAGES/DRESSINGS) ×1 IMPLANT
DRAPE FOOT SWITCH (DRAPES) ×1 IMPLANT
DRAPE STERI IOBAN 125X83 (DRAPES) ×1 IMPLANT
DRAPE U-SHAPE 47X51 STRL (DRAPES) ×2 IMPLANT
DRSG AQUACEL AG ADV 3.5X10 (GAUZE/BANDAGES/DRESSINGS) ×1 IMPLANT
DURAPREP 26ML APPLICATOR (WOUND CARE) ×1 IMPLANT
ELECT REM PT RETURN 15FT ADLT (MISCELLANEOUS) ×1 IMPLANT
GLOVE BIO SURGEON STRL SZ 6.5 (GLOVE) IMPLANT
GLOVE BIO SURGEON STRL SZ7 (GLOVE) IMPLANT
GLOVE BIO SURGEON STRL SZ8 (GLOVE) ×1 IMPLANT
GLOVE BIOGEL PI IND STRL 7.0 (GLOVE) IMPLANT
GLOVE BIOGEL PI IND STRL 8 (GLOVE) ×1 IMPLANT
GOWN STRL REUS W/ TWL LRG LVL3 (GOWN DISPOSABLE) ×2 IMPLANT
HOLDER FOLEY CATH W/STRAP (MISCELLANEOUS) ×1 IMPLANT
KIT TURNOVER KIT A (KITS) ×1 IMPLANT
MANIFOLD NEPTUNE II (INSTRUMENTS) ×1 IMPLANT
PACK ANTERIOR HIP CUSTOM (KITS) ×1 IMPLANT
PENCIL SMOKE EVACUATOR COATED (MISCELLANEOUS) ×1 IMPLANT
PIN ALTRX NEUTRAL X OD 32X52 (Pin) IMPLANT
SPIKE FLUID TRANSFER (MISCELLANEOUS) ×1 IMPLANT
STEM FEM ACTIS STD SZ2 (Stem) IMPLANT
SUT ETHIBOND NAB CT1 #1 30IN (SUTURE) ×1 IMPLANT
SUT MNCRL AB 4-0 PS2 18 (SUTURE) ×1 IMPLANT
SUT VIC AB 2-0 CT1 TAPERPNT 27 (SUTURE) ×2 IMPLANT
SUTURE STRATFX 0 PDS 27 VIOLET (SUTURE) ×1 IMPLANT
TOWEL GREEN STERILE FF (TOWEL DISPOSABLE) ×1 IMPLANT
TRAY FOLEY MTR SLVR 16FR STAT (SET/KITS/TRAYS/PACK) ×1 IMPLANT
TUBE SUCTION HIGH CAP CLEAR NV (SUCTIONS) ×1 IMPLANT

## 2023-09-12 NOTE — Transfer of Care (Signed)
 Immediate Anesthesia Transfer of Care Note  Patient: Olivia Hodges  Procedure(s) Performed: LEFT TOTAL HIP ARTHROPLASTY, ANTERIOR APPROACH (Left: Hip)  Patient Location: PACU  Anesthesia Type:General  Level of Consciousness: drowsy  Airway & Oxygen Therapy: Patient Spontanous Breathing and Patient connected to face mask oxygen  Post-op Assessment: Report given to RN and Post -op Vital signs reviewed and stable  Post vital signs: Reviewed and stable  Last Vitals:  Vitals Value Taken Time  BP 136/87 09/12/23 08:49  Temp    Pulse 71 09/12/23 08:51  Resp 16 09/12/23 08:51  SpO2 100 % 09/12/23 08:51  Vitals shown include unfiled device data.  Last Pain:  Vitals:   09/12/23 0621  TempSrc:   PainSc: 2          Complications: No notable events documented.

## 2023-09-12 NOTE — Anesthesia Preprocedure Evaluation (Addendum)
 Anesthesia Evaluation  Patient identified by MRN, date of birth, ID band Patient awake    Reviewed: Allergy & Precautions, NPO status , Patient's Chart, lab work & pertinent test results  Airway Mallampati: II  TM Distance: >3 FB Neck ROM: Full    Dental no notable dental hx.    Pulmonary sleep apnea and Continuous Positive Airway Pressure Ventilation    Pulmonary exam normal        Cardiovascular hypertension, Pt. on medications Normal cardiovascular exam     Neuro/Psych negative neurological ROS  negative psych ROS   GI/Hepatic negative GI ROS, Neg liver ROS,,,  Endo/Other  diabetes    Renal/GU negative Renal ROS     Musculoskeletal  (+) Arthritis ,  Radiculopathy, lumbar region Anterolisthesis of lumbar spine   Abdominal   Peds  Hematology negative hematology ROS (+) PLT: 352   Anesthesia Other Findings Left hip osteoarthritis  Reproductive/Obstetrics                              Anesthesia Physical Anesthesia Plan  ASA: 2  Anesthesia Plan: General   Post-op Pain Management:    Induction: Intravenous  PONV Risk Score and Plan: 3 and Ondansetron , Dexamethasone , Midazolam  and Treatment may vary due to age or medical condition  Airway Management Planned: Oral ETT  Additional Equipment:   Intra-op Plan:   Post-operative Plan: Extubation in OR  Informed Consent: I have reviewed the patients History and Physical, chart, labs and discussed the procedure including the risks, benefits and alternatives for the proposed anesthesia with the patient or authorized representative who has indicated his/her understanding and acceptance.     Dental advisory given  Plan Discussed with: CRNA  Anesthesia Plan Comments:         Anesthesia Quick Evaluation

## 2023-09-12 NOTE — Interval H&P Note (Signed)
 History and Physical Interval Note:  09/12/2023 6:25 AM  Olivia Hodges  has presented today for surgery, with the diagnosis of Left hip osteoarthritis.  The various methods of treatment have been discussed with the patient and family. After consideration of risks, benefits and other options for treatment, the patient has consented to  Procedure(s): ARTHROPLASTY, HIP, TOTAL, ANTERIOR APPROACH (Left) as a surgical intervention.  The patient's history has been reviewed, patient examined, no change in status, stable for surgery.  I have reviewed the patient's chart and labs.  Questions were answered to the patient's satisfaction.     Dempsey Jordain Radin

## 2023-09-12 NOTE — Op Note (Signed)
 OPERATIVE REPORT- TOTAL HIP ARTHROPLASTY   PREOPERATIVE DIAGNOSIS: Osteoarthritis of the Left hip.   POSTOPERATIVE DIAGNOSIS: Osteoarthritis of the Left  hip.   PROCEDURE: Left total hip arthroplasty, anterior approach.   SURGEON: Dempsey Moan, MD   ASSISTANT: Roxie Mess, PA-C  ANESTHESIA:  General  ESTIMATED BLOOD LOSS:-650 mL    DRAINS: None  COMPLICATIONS: None   CONDITION: PACU - hemodynamically stable.   BRIEF CLINICAL NOTE: Olivia Hodges is a 48 y.o. female who has advanced end-  stage arthritis of their Left  hip with progressively worsening pain and  dysfunction.The patient has failed nonoperative management and presents for  total hip arthroplasty.   PROCEDURE IN DETAIL: After successful administration of spinal  anesthetic, the traction boots for the [facility] bed were placed on both  feet and the patient was placed onto the [facility] bed, boots placed into the leg  holders. The Left hip was then isolated from the perineum with plastic  drapes and prepped and draped in the usual sterile fashion. ASIS and  greater trochanter were marked and a oblique incision was made, starting  at about 1 cm lateral and 2 cm distal to the ASIS and coursing towards  the anterior cortex of the femur. The skin was cut with a 10 blade  through subcutaneous tissue to the level of the fascia overlying the  tensor fascia lata muscle. The fascia was then incised in line with the  incision at the junction of the anterior third and posterior 2/3rd. The  muscle was teased off the fascia and then the interval between the TFL  and the rectus was developed. The Hohmann retractor was then placed at  the top of the femoral neck over the capsule. The vessels overlying the  capsule were cauterized and the fat on top of the capsule was removed.  A Hohmann retractor was then placed anterior underneath the rectus  femoris to give exposure to the entire anterior capsule. A T-shaped  capsulotomy  was performed. The edges were tagged and the femoral head  was identified.       Osteophytes are removed off the superior acetabulum.  The femoral neck was then cut in situ with an oscillating saw. Traction  was then applied to the left lower extremity utilizing the [facility]  traction. The femoral head was then removed. Retractors were placed  around the acetabulum and then circumferential removal of the labrum was  performed. Osteophytes were also removed. Reaming starts at 47 mm to  medialize and  Increased in 2 mm increments to 51 mm. We reamed in  approximately 40 degrees of abduction, 20 degrees anteversion. A 52 mm  pinnacle acetabular shell was then impacted in anatomic position under  fluoroscopic guidance with excellent purchase. We did not need to place  any additional dome screws. A 32 mm neutral + 4 Altrx liner was then  placed into the acetabular shell.       The femoral lift was then placed along the lateral aspect of the femur  just distal to the vastus ridge. The leg was  externally rotated and capsule  was stripped off the inferior aspect of the femoral neck down to the  level of the lesser trochanter, this was done with electrocautery. The femur was lifted after this was performed. The  leg was then placed in an extended and adducted position essentially delivering the femur. We also removed the capsule superiorly and the piriformis from the piriformis fossa to gain  excellent exposure of the  proximal femur. Rongeur was used to remove some cancellous bone to get  into the lateral portion of the proximal femur for placement of the  initial starter reamer. The starter broaches was placed  the starter broach  and was shown to go down the center of the canal. Broaching  with the Actis system was then performed starting at size 0  coursing  Up to size 2. A size 2 had excellent torsional and rotational  and axial stability. The trial standard offset neck was then placed  with a 32 +  9 trial head. The hip was then reduced. We confirmed that  the stem was in the canal both on AP and lateral x-rays. It also has excellent sizing. The hip was reduced with outstanding stability through full extension and full external rotation.. AP pelvis was taken and the leg lengths were measured and found to be equal. Hip was then dislocated again and the femoral head and neck removed. The  femoral broach was removed. Size 2 Actis stem with a standard offset  neck was then impacted into the femur following native anteversion. Has  excellent purchase in the canal. Excellent torsional and rotational and  axial stability. It is confirmed to be in the canal on AP and lateral  fluoroscopic views. The 32 + 9 ceramic head was placed and the hip  reduced with outstanding stability. Again AP pelvis was taken and it  confirmed that the leg lengths were equal. The wound was then copiously  irrigated with saline solution and the capsule reattached and repaired  with Ethibond suture. 30 ml of .25% Bupivicaine was  injected into the capsule and into the edge of the tensor fascia lata as well as subcutaneous tissue. The fascia overlying the tensor fascia lata was then closed with a running #1 V-Loc. Subcu was closed with interrupted 2-0 Vicryl and subcuticular running 4-0 Monocryl. Incision was cleaned  and dried. Steri-Strips and a bulky sterile dressing applied. The patient was awakened and transported to  recovery in stable condition.        Please note that a surgical assistant was a medical necessity for this procedure to perform it in a safe and expeditious manner. Assistant was necessary to provide appropriate retraction of vital neurovascular structures and to prevent femoral fracture and allow for anatomic placement of the prosthesis.  Dempsey Moan, M.D.

## 2023-09-12 NOTE — Anesthesia Procedure Notes (Signed)
 Procedure Name: Intubation Date/Time: 09/12/2023 7:18 AM  Performed by: Therisa Doyal CROME, CRNAPre-anesthesia Checklist: Patient identified, Emergency Drugs available, Suction available and Patient being monitored Patient Re-evaluated:Patient Re-evaluated prior to induction Oxygen Delivery Method: Circle system utilized Preoxygenation: Pre-oxygenation with 100% oxygen Induction Type: IV induction Ventilation: Mask ventilation without difficulty Laryngoscope Size: Miller and 2 Grade View: Grade I Tube type: Oral Tube size: 7.5 mm Number of attempts: 1 Airway Equipment and Method: Stylet and Oral airway Placement Confirmation: ETT inserted through vocal cords under direct vision, positive ETCO2 and breath sounds checked- equal and bilateral Secured at: 20 cm Tube secured with: Tape Dental Injury: Teeth and Oropharynx as per pre-operative assessment

## 2023-09-12 NOTE — Discharge Instructions (Addendum)
 Dempsey Moan, MD Total Joint Specialist [facility] [place] [address], [address] [place], [place] [address] [phone]  ANTERIOR APPROACH TOTAL HIP REPLACEMENT POSTOPERATIVE DIRECTIONS     Hip Rehabilitation, Guidelines Following Surgery  The results of a hip operation are greatly improved after range of motion and muscle strengthening exercises. Follow all safety measures which are given to protect your hip. If any of these exercises cause increased pain or swelling in your joint, decrease the amount until you are comfortable again. Then slowly increase the exercises. Call your caregiver if you have problems or questions.   BLOOD CLOT PREVENTION Take a 10 mg Xarelto  once a day for three weeks following surgery.  You may resume your vitamins/supplements once you have discontinued the Xarelto . Do not take any NSAIDs (Advil, Aleve, Ibuprofen, Meloxicam , etc.) until you have discontinued the Xarelto .   HOME CARE INSTRUCTIONS  Remove items at home which could result in a fall. This includes throw rugs or furniture in walking pathways.  ICE to the affected hip as frequently as 20-30 minutes an hour and then as needed for pain and swelling. Continue to use ice on the hip for pain and swelling from surgery. You may notice swelling that will progress down to the foot and ankle. This is normal after surgery. Elevate the leg when you are not up walking on it.   Continue to use the breathing machine which will help keep your temperature down.  It is common for your temperature to cycle up and down following surgery, especially at night when you are not up moving around and exerting yourself.  The breathing machine keeps your lungs expanded and your temperature down.  DIET You may resume your previous home diet once your are discharged from the hospital.  DRESSING / WOUND CARE / SHOWERING You have an adhesive waterproof bandage over the incision. Leave this in place until your first  follow-up appointment. Once you remove this you will not need to place another bandage.  You may begin showering 3 days following surgery, but do not submerge the incision under water .  ACTIVITY For the first 3-5 days, it is important to rest and keep the operative leg elevated. You should, as a general rule, rest for 50 minutes and walk/stretch for 10 minutes per hour. After 5 days, you may slowly increase activity as tolerated.  Perform the exercises you were provided twice a day for about 15-20 minutes each session. Begin these 2 days following surgery. Walk with your walker as instructed. Use the walker until you are comfortable transitioning to a cane. Walk with the cane in the opposite hand of the operative leg. You may discontinue the cane once you are comfortable and walking steadily. Avoid periods of inactivity such as sitting longer than an hour when not asleep. This helps prevent blood clots.  Do not drive a car for 6 weeks or until released by your surgeon.  Do not drive while taking narcotics.  TED HOSE STOCKINGS Wear the elastic stockings on both legs for three weeks following surgery during the day. You may remove them at night while sleeping.  WEIGHT BEARING Weight bearing as tolerated with assist device (walker, cane, etc) as directed, use it as long as suggested by your surgeon or therapist, typically at least 4-6 weeks.  POSTOPERATIVE CONSTIPATION PROTOCOL Constipation - defined medically as fewer than three stools per week and severe constipation as less than one stool per week.  One of the most common issues patients have  following surgery is constipation.  Even if you have a regular bowel pattern at home, your normal regimen is likely to be disrupted due to multiple reasons following surgery.  Combination of anesthesia, postoperative narcotics, change in appetite and fluid intake all can affect your bowels.  In order to avoid complications following surgery, here are some  recommendations in order to help you during your recovery period.  Colace (docusate) - Pick up an over-the-counter form of Colace or another stool softener and take twice a day as long as you are requiring postoperative pain medications.  Take with a full glass of water  daily.  If you experience loose stools or diarrhea, hold the colace until you stool forms back up.  If your symptoms do not get better within 1 week or if they get worse, check with your doctor. Dulcolax (bisacodyl) - Pick up over-the-counter and take as directed by the product packaging as needed to assist with the movement of your bowels.  Take with a full glass of water .  Use this product as needed if not relieved by Colace only.  MiraLax (polyethylene glycol) - Pick up over-the-counter to have on hand.  MiraLax is a solution that will increase the amount of water  in your bowels to assist with bowel movements.  Take as directed and can mix with a glass of water , juice, soda, coffee, or tea.  Take if you go more than two days without a movement.Do not use MiraLax more than once per day. Call your doctor if you are still constipated or irregular after using this medication for 7 days in a row.  If you continue to have problems with postoperative constipation, please contact the office for further assistance and recommendations.  If you experience the worst abdominal pain ever or develop nausea or vomiting, please contact the office immediatly for further recommendations for treatment.  ITCHING  If you experience itching with your medications, try taking only a single pain pill, or even half a pain pill at a time.  You can also use Benadryl  over the counter for itching or also to help with sleep.   MEDICATIONS See your medication summary on the "After Visit Summary" that the nursing staff will review with you prior to discharge.  You may have some home medications which will be placed on hold until you complete the course of blood  thinner medication.  It is important for you to complete the blood thinner medication as prescribed by your surgeon.  Continue your approved medications as instructed at time of discharge.  PRECAUTIONS If you experience chest pain or shortness of breath - call 911 immediately for transfer to the hospital emergency department.  If you develop a fever greater that 101 F, purulent drainage from wound, increased redness or drainage from wound, foul odor from the wound/dressing, or calf pain - CONTACT YOUR SURGEON.                                                   FOLLOW-UP APPOINTMENTS Make sure you keep all of your appointments after your operation with your surgeon and caregivers. You should call the office at the above phone number and make an appointment for approximately two weeks after the date of your surgery or on the date instructed by your surgeon outlined in the After Visit Summary.  RANGE OF MOTION  AND STRENGTHENING EXERCISES  These exercises are designed to help you keep full movement of your hip joint. Follow your caregiver's or physical therapist's instructions. Perform all exercises about fifteen times, three times per day or as directed. Exercise both hips, even if you have had only one joint replacement. These exercises can be done on a training (exercise) mat, on the floor, on a table or on a bed. Use whatever works the best and is most comfortable for you. Use music or television while you are exercising so that the exercises are a pleasant break in your day. This will make your life better with the exercises acting as a break in routine you can look forward to.  Lying on your back, slowly slide your foot toward your buttocks, raising your knee up off the floor. Then slowly slide your foot back down until your leg is straight again.  Lying on your back spread your legs as far apart as you can without causing discomfort.  Lying on your side, raise your upper leg and foot straight up  from the floor as far as is comfortable. Slowly lower the leg and repeat.  Lying on your back, tighten up the muscle in the front of your thigh (quadriceps muscles). You can do this by keeping your leg straight and trying to raise your heel off the floor. This helps strengthen the largest muscle supporting your knee.  Lying on your back, tighten up the muscles of your buttocks both with the legs straight and with the knee bent at a comfortable angle while keeping your heel on the floor.   POST-OPERATIVE OPIOID TAPER INSTRUCTIONS: It is important to wean off of your opioid medication as soon as possible. If you do not need pain medication after your surgery it is ok to stop day one. Opioids include: Codeine, Hydrocodone (Norco, Vicodin), Oxycodone (Percocet, oxycontin ) and hydromorphone  amongst others.  Long term and even short term use of opiods can cause: Increased pain response Dependence Constipation Depression Respiratory depression And more.  Withdrawal symptoms can include Flu like symptoms Nausea, vomiting And more Techniques to manage these symptoms Hydrate well Eat regular healthy meals Stay active Use relaxation techniques(deep breathing, meditating, yoga) Do Not substitute Alcohol to help with tapering If you have been on opioids for less than two weeks and do not have pain than it is ok to stop all together.  Plan to wean off of opioids This plan should start within one week post op of your joint replacement. Maintain the same interval or time between taking each dose and first decrease the dose.  Cut the total daily intake of opioids by one tablet each day Next start to increase the time between doses. The last dose that should be eliminated is the evening dose.   IF YOU ARE TRANSFERRED TO A SKILLED REHAB FACILITY If the patient is transferred to a skilled rehab facility following release from the hospital, a list of the current medications will be sent to the facility  for the patient to continue.  When discharged from the skilled rehab facility, please have the facility set up the patient's Home Health Physical Therapy prior to being released. Also, the skilled facility will be responsible for providing the patient with their medications at time of release from the facility to include their pain medication, the muscle relaxants, and their blood thinner medication. If the patient is still at the rehab facility at time of the two week follow up appointment, the skilled rehab facility will also  need to assist the patient in arranging follow up appointment in our office and any transportation needs.  MAKE SURE YOU:  Understand these instructions.  Get help right away if you are not doing well or get worse.    DENTAL ANTIBIOTICS:  In most cases prophylactic antibiotics for Dental procdeures after total joint surgery are not necessary.  Exceptions are as follows:  1. History of prior total joint infection  2. Severely immunocompromised (Organ Transplant, cancer chemotherapy, Rheumatoid biologic meds such as Humera)  3. Poorly controlled diabetes (A1C &gt; 8.0, blood glucose over 200)  If you have one of these conditions, contact your surgeon for an antibiotic prescription, prior to your dental procedure.    Pick up stool softner and laxative for home use following surgery while on pain medications. Do not submerge incision under water . Please use good hand washing techniques while changing dressing each day. May shower starting three days after surgery. Please use a clean towel to pat the incision dry following showers. Continue to use ice for pain and swelling after surgery. Do not use any lotions or creams on the incision until instructed by your surgeon.

## 2023-09-12 NOTE — Evaluation (Signed)
 Physical Therapy Evaluation Patient Details Name: Olivia Hodges MRN: [id number] DOB: 1975/09/28 Today's Date: 09/12/2023  History of Present Illness  48 yo female presents to therapy s/p L THA, anterior approach on 09/12/2023 due to failure of conservative measures. Pt PMH includes but is not limited to: DM II, OA, DJD, OSA on CPAP, LBP,  gastric bypass and HTN.  Clinical Impression      Olivia Hodges is a 48 y.o. female POD 0 s/p L THA. Patient reports mod I with mobility at baseline. Patient is now limited by functional impairments (see PT problem list below) and requires CGA and cues for transfers and gait with RW. Patient was able to ambulate 60 feet with RW and CGA and cues for safe walker management. Patient educated on safe sequencing for stair mobility with L handrail only HHA or SPC, fall risk prevention, slowly increasing activity level, pain management and goal, use of CP/ice and car tranfers pt  and spouse verbalized understanding of safe guarding position for people assisting with mobility. Patient instructed in exercises to facilitate ROM and circulation reviewed and HO provided. Patient will benefit from continued skilled PT interventions to address impairments and progress towards PLOF. Patient has met mobility goals at adequate level for discharge home with family support and HEP; will continue to follow if pt continues acute stay to progress towards Mod I goals.     If plan is discharge home, recommend the following: A little help with walking and/or transfers;A little help with bathing/dressing/bathroom;Assistance with cooking/housework;Assist for transportation;Help with stairs or ramp for entrance   Can travel by private vehicle        Equipment Recommendations None recommended by PT  Recommendations for Other Services       Functional Status Assessment Patient has had a recent decline in their functional status and demonstrates the ability to make significant improvements  in function in a reasonable and predictable amount of time.     Precautions / Restrictions Precautions Precautions: Fall Restrictions Weight Bearing Restrictions Per Provider Order: No      Mobility  Bed Mobility Overal bed mobility: Needs Assistance Bed Mobility: Supine to Sit     Supine to sit: Supervision, HOB elevated     General bed mobility comments: min cues    Transfers Overall transfer level: Needs assistance Equipment used: Rolling walker (2 wheels) Transfers: Sit to/from Stand Sit to Stand: Contact guard assist           General transfer comment: min cues, pt reports mild dizziness no evidence of orthostatic hypotension    Ambulation/Gait Ambulation/Gait assistance: Contact guard assist Gait Distance (Feet): 60 Feet Assistive device: Rolling walker (2 wheels) Gait Pattern/deviations: Step-to pattern, Decreased stance time - left, Antalgic, Trunk flexed Gait velocity: decreased     General Gait Details: slight trunk flexion with B UE support at RW to offload L LE in stance phase, min cues for safety and RW management, slight L toe out and minimal lateral sway with L knee flexion noted thro9ughout gait cycle  Stairs Stairs: Yes Stairs assistance: Contact guard assist Stair Management: One rail Left, Two rails Number of Stairs: 2 General stair comments: step navigaiton initiated with B handrails with cues for safety, sequencing and technique, pt able to progress to L handrail and HHA to navigate 2 additional steps, spouse able to provide HHA or use of SPC to navigate steps to enter home and 3 steps to access bed room  Wheelchair Mobility     Tilt Bed  Modified Rankin (Stroke Patients Only)       Balance Overall balance assessment: Needs assistance Sitting-balance support: Feet supported Sitting balance-Leahy Scale: Good     Standing balance support: Bilateral upper extremity supported, During functional activity, Reliant on assistive  device for balance Standing balance-Leahy Scale: Poor                               Pertinent Vitals/Pain Pain Assessment Pain Assessment: 0-10 Pain Score: 7  Pain Location: L hip and  LE Pain Descriptors / Indicators: Aching, Sore, Discomfort, Operative site guarding Pain Intervention(s): Limited activity within patient's tolerance, Monitored during session, Premedicated before session, Repositioned, Ice applied    Home Living Family/patient expects to be discharged to:: Private residence Living Arrangements: Spouse/significant other Available Help at Discharge: Family Type of Home: House Home Access: Stairs to enter Entrance Stairs-Rails: Left Entrance Stairs-Number of Steps: 2 Alternate Level Stairs-Number of Steps: 3 Home Layout: Multi-level Home Equipment: Cane - single Librarian, academic (2 wheels);Shower seat      Prior Function Prior Level of Function : Independent/Modified Independent;Driving;Working/employed             Mobility Comments: Use of SPC for prolonged community distances, no AD in home, IND for all ADLs, self care tasks, IADLs ADLs Comments: nurse     Extremity/Trunk Assessment        Lower Extremity Assessment Lower Extremity Assessment: LLE deficits/detail LLE Deficits / Details: ankle DF/PF 5/5 LLE Sensation: WNL    Cervical / Trunk Assessment Cervical / Trunk Assessment: Normal  Communication   Communication Communication: No apparent difficulties    Cognition Arousal: Alert Behavior During Therapy: WFL for tasks assessed/performed   PT - Cognitive impairments: No apparent impairments                         Following commands: Intact       Cueing       General Comments      Exercises Total Joint Exercises Ankle Circles/Pumps: AROM, Both, 10 reps Quad Sets: AROM, Left, 5 reps Heel Slides: AROM, Left, 5 reps Hip ABduction/ADduction: AROM, Left, 5 reps, Standing Long Arc Quad: AROM, Left, 5  reps, Standing Knee Flexion: AROM, Left, 5 reps, Standing Standing Hip Extension: AROM, Left, 5 reps, Standing   Assessment/Plan    PT Assessment Patient needs continued PT services  PT Problem List Decreased strength;Decreased range of motion;Decreased activity tolerance;Decreased balance;Decreased mobility;Decreased coordination;Pain       PT Treatment Interventions DME instruction;Gait training;Stair training;Functional mobility training;Therapeutic activities;Therapeutic exercise;Balance training;Neuromuscular re-education;Patient/family education;Modalities    PT Goals (Current goals can be found in the Care Plan section)  Acute Rehab PT Goals Patient Stated Goal: walk and enjoy doing things again PT Goal Formulation: With patient Time For Goal Achievement: 09/26/23 Potential to Achieve Goals: Good    Frequency 7X/week     Co-evaluation               AM-PAC PT 6 Clicks Mobility  Outcome Measure Help needed turning from your back to your side while in a flat bed without using bedrails?: None Help needed moving from lying on your back to sitting on the side of a flat bed without using bedrails?: A Little Help needed moving to and from a bed to a chair (including a wheelchair)?: A Little Help needed standing up from a chair using your arms (e.g., wheelchair or bedside chair)?:  A Little Help needed to walk in hospital room?: A Little Help needed climbing 3-5 steps with a railing? : A Little 6 Click Score: 19    End of Session Equipment Utilized During Treatment: Gait belt Activity Tolerance: Treatment limited secondary to medical complications (Comment);Patient limited by fatigue;Patient limited by pain (Nausea) Patient left: in chair;with call bell/phone within reach;with family/visitor present Nurse Communication: Mobility status;Other (comment) (pt readiness for d/c from PT standpoint) PT Visit Diagnosis: Unsteadiness on feet (R26.81);Other abnormalities of gait  and mobility (R26.89);Muscle weakness (generalized) (M62.81);Difficulty in walking, not elsewhere classified (R26.2);Pain Pain - Right/Left: Left Pain - part of body: Leg;Hip    Time: 8979-8884 PT Time Calculation (min) (ACUTE ONLY): 55 min   Charges:   PT Evaluation $PT Eval Low Complexity: 1 Low PT Treatments $Gait Training: 8-22 mins $Therapeutic Exercise: 8-22 mins $Therapeutic Activity: 8-22 mins PT General Charges $$ ACUTE PT VISIT: 1 Visit         [place], PT Acute Rehab   [place] VEAR Drone 09/12/2023, 11:17 AM

## 2023-09-12 NOTE — Anesthesia Postprocedure Evaluation (Signed)
 Anesthesia Post Note  Patient: Sumayya Bruski  Procedure(s) Performed: LEFT TOTAL HIP ARTHROPLASTY, ANTERIOR APPROACH (Left: Hip)     Patient location during evaluation: PACU Anesthesia Type: General Level of consciousness: awake Pain management: pain level controlled Vital Signs Assessment: post-procedure vital signs reviewed and stable Respiratory status: spontaneous breathing, nonlabored ventilation and respiratory function stable Cardiovascular status: blood pressure returned to baseline and stable Postop Assessment: no apparent nausea or vomiting Anesthetic complications: no   No notable events documented.  Last Vitals:  Vitals:   09/12/23 1000 09/12/23 1100  BP: 110/71 112/72  Pulse: 66 (!) 55  Resp: 12 12  Temp:    SpO2: 93% 99%    Last Pain:  Vitals:   09/12/23 1100  TempSrc:   PainSc: 2                  Soliyana Mcchristian P Bond [place]

## 2023-09-12 NOTE — Progress Notes (Signed)
 Pt states she has been told By her PCP that she is no longer diabetic and hasn't been for the last 2 years . PT labs are within normal limits, pt does not meet periop glycemic protocol .

## 2023-09-13 ENCOUNTER — Encounter (HOSPITAL_COMMUNITY): Payer: Self-pay | Admitting: Orthopedic Surgery

## 2023-09-27 ENCOUNTER — Other Ambulatory Visit (HOSPITAL_COMMUNITY): Payer: Self-pay

## 2023-09-30 ENCOUNTER — Other Ambulatory Visit (HOSPITAL_COMMUNITY): Payer: Self-pay

## 2023-09-30 ENCOUNTER — Other Ambulatory Visit: Payer: Self-pay

## 2023-09-30 ENCOUNTER — Encounter: Payer: Self-pay | Admitting: Medical-Surgical

## 2023-09-30 MED ORDER — SCOPOLAMINE 1 MG/3DAYS TD PT72
1.0000 | MEDICATED_PATCH | TRANSDERMAL | 0 refills | Status: AC
  Filled 2023-09-30: qty 10, 30d supply, fill #0

## 2023-10-24 ENCOUNTER — Encounter: Payer: Self-pay | Admitting: Medical-Surgical

## 2023-10-25 ENCOUNTER — Other Ambulatory Visit (HOSPITAL_COMMUNITY): Payer: Self-pay

## 2023-10-25 ENCOUNTER — Other Ambulatory Visit: Payer: Self-pay | Admitting: Medical-Surgical

## 2023-10-25 ENCOUNTER — Other Ambulatory Visit: Payer: Self-pay

## 2023-10-25 DIAGNOSIS — Z5189 Encounter for other specified aftercare: Secondary | ICD-10-CM | POA: Diagnosis not present

## 2023-10-25 MED ORDER — TIZANIDINE HCL 4 MG PO TABS
4.0000 mg | ORAL_TABLET | Freq: Every day | ORAL | 3 refills | Status: AC
  Filled 2023-10-25: qty 90, 90d supply, fill #0
  Filled 2024-01-18: qty 90, 90d supply, fill #1

## 2023-11-16 ENCOUNTER — Other Ambulatory Visit: Payer: Self-pay

## 2023-12-01 ENCOUNTER — Ambulatory Visit (INDEPENDENT_AMBULATORY_CARE_PROVIDER_SITE_OTHER)

## 2023-12-01 DIAGNOSIS — Z23 Encounter for immunization: Secondary | ICD-10-CM | POA: Diagnosis not present

## 2023-12-05 ENCOUNTER — Other Ambulatory Visit (HOSPITAL_COMMUNITY): Payer: Self-pay

## 2023-12-23 ENCOUNTER — Other Ambulatory Visit: Payer: Self-pay

## 2023-12-23 ENCOUNTER — Other Ambulatory Visit: Payer: Self-pay | Admitting: Medical-Surgical

## 2023-12-23 ENCOUNTER — Other Ambulatory Visit (HOSPITAL_COMMUNITY): Payer: Self-pay

## 2023-12-26 MED ORDER — MONTELUKAST SODIUM 10 MG PO TABS
10.0000 mg | ORAL_TABLET | Freq: Every day | ORAL | 1 refills | Status: AC
  Filled 2023-12-26: qty 90, 90d supply, fill #0

## 2023-12-27 ENCOUNTER — Other Ambulatory Visit (HOSPITAL_COMMUNITY): Payer: Self-pay

## 2023-12-27 ENCOUNTER — Other Ambulatory Visit: Payer: Self-pay

## 2024-01-18 ENCOUNTER — Other Ambulatory Visit: Payer: Self-pay

## 2024-02-07 ENCOUNTER — Other Ambulatory Visit: Payer: Self-pay | Admitting: Medical-Surgical

## 2024-02-07 MED ORDER — PANTOPRAZOLE SODIUM 40 MG PO TBEC
40.0000 mg | DELAYED_RELEASE_TABLET | Freq: Every day | ORAL | 1 refills | Status: AC
  Filled 2024-02-07: qty 90, 90d supply, fill #0

## 2024-02-08 ENCOUNTER — Other Ambulatory Visit: Payer: Self-pay

## 2024-02-27 ENCOUNTER — Other Ambulatory Visit: Payer: Self-pay | Admitting: Medical-Surgical

## 2024-02-27 DIAGNOSIS — R809 Proteinuria, unspecified: Secondary | ICD-10-CM

## 2024-02-27 DIAGNOSIS — Z9884 Bariatric surgery status: Secondary | ICD-10-CM

## 2024-02-29 ENCOUNTER — Other Ambulatory Visit (HOSPITAL_COMMUNITY): Payer: Self-pay

## 2024-02-29 MED ORDER — LOSARTAN POTASSIUM 25 MG PO TABS
12.5000 mg | ORAL_TABLET | Freq: Every day | ORAL | 3 refills | Status: AC
  Filled 2024-02-29: qty 45, 90d supply, fill #0

## 2024-03-01 ENCOUNTER — Other Ambulatory Visit (HOSPITAL_COMMUNITY): Payer: Self-pay

## 2024-07-12 ENCOUNTER — Encounter: Admitting: Medical-Surgical
